# Patient Record
Sex: Female | Born: 1955 | Race: White | Hispanic: No | Smoking: Never smoker
Health system: Southern US, Community
[De-identification: ages and names within clinical notes are randomized; demographics above are authoritative.]

## PROBLEM LIST (undated history)

## (undated) DIAGNOSIS — I10 Essential (primary) hypertension: Secondary | ICD-10-CM

## (undated) DIAGNOSIS — E785 Hyperlipidemia, unspecified: Secondary | ICD-10-CM

## (undated) DIAGNOSIS — T7840XA Allergy, unspecified, initial encounter: Secondary | ICD-10-CM

## (undated) DIAGNOSIS — J45909 Unspecified asthma, uncomplicated: Secondary | ICD-10-CM

## (undated) HISTORY — PX: TUBAL LIGATION: SHX77

## (undated) HISTORY — PX: MENISCUS REPAIR: SHX5179

## (undated) HISTORY — DX: Hyperlipidemia, unspecified: E78.5

## (undated) HISTORY — PX: ABDOMINAL HYSTERECTOMY: SHX81

## (undated) HISTORY — DX: Allergy, unspecified, initial encounter: T78.40XA

## (undated) HISTORY — PX: CHOLECYSTECTOMY: SHX55

## (undated) HISTORY — PX: COLONOSCOPY: SHX174

---

## 1998-04-19 ENCOUNTER — Encounter: Admission: RE | Admit: 1998-04-19 | Discharge: 1998-07-18 | Payer: Self-pay | Admitting: Internal Medicine

## 2000-06-30 ENCOUNTER — Other Ambulatory Visit: Admission: RE | Admit: 2000-06-30 | Discharge: 2000-06-30 | Payer: Self-pay | Admitting: Gynecology

## 2000-07-06 ENCOUNTER — Other Ambulatory Visit: Admission: RE | Admit: 2000-07-06 | Discharge: 2000-07-06 | Payer: Self-pay | Admitting: Gynecology

## 2000-08-11 ENCOUNTER — Inpatient Hospital Stay (HOSPITAL_COMMUNITY): Admission: RE | Admit: 2000-08-11 | Discharge: 2000-08-13 | Payer: Self-pay | Admitting: Gynecology

## 2001-09-19 ENCOUNTER — Other Ambulatory Visit: Admission: RE | Admit: 2001-09-19 | Discharge: 2001-09-19 | Payer: Self-pay | Admitting: Gynecology

## 2001-10-31 ENCOUNTER — Ambulatory Visit (HOSPITAL_COMMUNITY): Admission: RE | Admit: 2001-10-31 | Discharge: 2001-11-01 | Payer: Self-pay | Admitting: General Surgery

## 2001-10-31 ENCOUNTER — Encounter: Payer: Self-pay | Admitting: General Surgery

## 2003-08-10 ENCOUNTER — Other Ambulatory Visit: Admission: RE | Admit: 2003-08-10 | Discharge: 2003-08-10 | Payer: Self-pay | Admitting: Gynecology

## 2004-08-22 ENCOUNTER — Other Ambulatory Visit: Admission: RE | Admit: 2004-08-22 | Discharge: 2004-08-22 | Payer: Self-pay | Admitting: Gynecology

## 2006-02-01 ENCOUNTER — Ambulatory Visit: Payer: Self-pay | Admitting: Internal Medicine

## 2006-02-16 ENCOUNTER — Ambulatory Visit: Payer: Self-pay | Admitting: Internal Medicine

## 2006-08-30 ENCOUNTER — Encounter: Admission: RE | Admit: 2006-08-30 | Discharge: 2006-08-30 | Payer: Self-pay | Admitting: Internal Medicine

## 2008-07-17 ENCOUNTER — Emergency Department (HOSPITAL_COMMUNITY): Admission: EM | Admit: 2008-07-17 | Discharge: 2008-07-17 | Payer: Self-pay | Admitting: Emergency Medicine

## 2008-07-19 ENCOUNTER — Ambulatory Visit: Payer: Self-pay

## 2008-08-01 ENCOUNTER — Ambulatory Visit: Payer: Self-pay | Admitting: Cardiology

## 2011-02-24 NOTE — Assessment & Plan Note (Signed)
[facility]                            CARDIOLOGY OFFICE NOTE   JUDEE, Sharon Buck                        MRN:          [id number]  DATE:08/01/2008                            DOB:          10-Robbs-1957    PRIMARY CARDIOLOGIST:  Bevelyn Buckles. Bensimhon, MD   PRIMARY CARE PHYSICIAN:  Oretha Milch, MD, [facility].   Sharon Buck is a very pleasant 55 year old white female who went to [facility] ER  with chest pain.  She ruled out for an MI and was seen by Dr. Gala Romney  and scheduled for an outpatient stress test.  On stress Myoview, the  patient had excellent exercise tolerance.  She exercised for 11 minutes,  stopped due to fatigue.  She did have some chest pain and shortness of  breath and equivocal ST changes.  There was no evidence of scar or  ischemia and ejection fraction was 71%.  The patient continues to have  some chest pain, shortness of breath and pounding.  She says she can  exercise at her club for 30 minutes on a bike or elliptical without any  problems.  She can be walking on [employer] and developed a pounding  associated with pressure and shortness of breath.  She feels it is all  stress-related.  She is trying to rise 2 of her grandchildren on her own  and has quite a bit of stress at work.  Cardiac risk factor is  significant for hypertension which is treated.  She is borderline  diabetic.  She has never smoked.  She has no cholesterol problems.  Her  father died of heart failure.  Both paternal grandparents died of heart  failure.  She does not know if any had MIs.  Mother died of a motor  vehicle accident, one sister died of diabetes complications as a child.  One brother is alive and well   ALLERGIES:  PREDNISONE.   CURRENT MEDICATIONS:  1. Lasix 80 mg b.i.d.  2. Avapro 150 mg one-half tablet daily.  3. Aspirin 81 mg daily.  4. Singulair.   PAST MEDICAL HISTORY:  Significant for asthma, diverticulosis,  hypertension and borderline  diabetes mellitus.   PHYSICAL EXAMINATION:  GENERAL:  This is a pleasant but anxious 55-year-  old white female in no acute distress.  VITAL SIGNS:  Blood pressure 128/78, pulse 62, weight 238.  NECK:  Without JVD, HJR, bruit, or thyroid enlargement.  LUNGS:  Clear anterior, posterior and lateral.  HEART:  Regular rate and rhythm at 62 beats per minute.  Normal S1 and  S2.  No murmur, rub, bruit, thrill or heave noted.  Distant heart  sounds.  ABDOMEN:  Soft without organomegaly, masses, lesions or abnormal  tenderness.  EXTREMITIES:  Without cyanosis, clubbing, or edema.  She has good distal  pulses.   EKG sinus bradycardia, otherwise normal.   IMPRESSION:  1. Chest pain, palpitations, shortness of breath with negative stress      Myoview on July 19, 2008.  2. Stress and anxiety.  3. Hypertension.  4. Borderline diabetes mellitus.  5. Questionable  family history of heart disease with several members      dying of heart failure   PLAN:  I discussed the possibility of an event recorder and 2-D echo.  The patient states she has had an enlarged heart by echo in the past.  The patient wants to pay off her bills at this time and try to reduce  the stress in her life and see if that makes a difference, which I think  is a reasonable approach.  We will schedule her to see Dr. Gala Romney  back in 2 months' time and at which time he can decide whether or not to  proceed with any further testing based on her symptoms.      Jacolyn Reedy, PA-C  Electronically Signed      Everardo Beals. Juanda Chance, MD, [facility]  Electronically Signed   ML/MedQ  DD: 08/01/2008  DT: 08/01/2008  Job #: [id number]   cc:   Larina Earthly, M.D.

## 2011-07-14 LAB — BASIC METABOLIC PANEL
BUN: 18
CO2: 22
Calcium: 9.5
Chloride: 103
Creatinine, Ser: 0.94
GFR calc Af Amer: >60
GFR calc non Af Amer: >60
Glucose, Bld: 98
Potassium: 3.7
Sodium: 134 — ABNORMAL LOW

## 2011-07-14 LAB — POCT CARDIAC MARKERS
CKMB, poc: <1 — ABNORMAL LOW
Myoglobin, poc: 103
Troponin i, poc: <0.05

## 2011-07-14 LAB — CBC
HCT: 40.3
Hemoglobin: 13.9
MCHC: 34.3
MCV: 91.8
Platelets: 237
RBC: 4.39
RDW: 12.8
WBC: 6.5

## 2011-07-14 LAB — DIFFERENTIAL
Basophils Absolute: 0.1
Basophils Relative: 1
Eosinophils Absolute: 0.1
Eosinophils Relative: 2
Lymphocytes Relative: 38
Lymphs Abs: 2.5
Monocytes Absolute: 0.4
Monocytes Relative: 6
Neutro Abs: 3.5
Neutrophils Relative %: 53

## 2013-02-28 ENCOUNTER — Emergency Department (HOSPITAL_COMMUNITY)
Admission: EM | Admit: 2013-02-28 | Discharge: 2013-03-01 | Disposition: A | Discharge status: Home / Self Care | Payer: No Typology Code available for payment source | Attending: Emergency Medicine | Admitting: Emergency Medicine

## 2013-02-28 ENCOUNTER — Emergency Department (HOSPITAL_COMMUNITY): Payer: No Typology Code available for payment source

## 2013-02-28 ENCOUNTER — Encounter (HOSPITAL_COMMUNITY): Payer: Self-pay | Admitting: Emergency Medicine

## 2013-02-28 DIAGNOSIS — S39012A Strain of muscle, fascia and tendon of lower back, initial encounter: Secondary | ICD-10-CM

## 2013-02-28 DIAGNOSIS — S20219A Contusion of unspecified front wall of thorax, initial encounter: Secondary | ICD-10-CM | POA: Insufficient documentation

## 2013-02-28 DIAGNOSIS — Z79899 Other long term (current) drug therapy: Secondary | ICD-10-CM | POA: Insufficient documentation

## 2013-02-28 DIAGNOSIS — S335XXA Sprain of ligaments of lumbar spine, initial encounter: Secondary | ICD-10-CM | POA: Insufficient documentation

## 2013-02-28 DIAGNOSIS — S139XXA Sprain of joints and ligaments of unspecified parts of neck, initial encounter: Secondary | ICD-10-CM | POA: Insufficient documentation

## 2013-02-28 DIAGNOSIS — Y92410 Unspecified street and highway as the place of occurrence of the external cause: Secondary | ICD-10-CM | POA: Insufficient documentation

## 2013-02-28 DIAGNOSIS — S161XXA Strain of muscle, fascia and tendon at neck level, initial encounter: Secondary | ICD-10-CM

## 2013-02-28 DIAGNOSIS — I10 Essential (primary) hypertension: Secondary | ICD-10-CM | POA: Insufficient documentation

## 2013-02-28 DIAGNOSIS — J45909 Unspecified asthma, uncomplicated: Secondary | ICD-10-CM | POA: Insufficient documentation

## 2013-02-28 DIAGNOSIS — S20212A Contusion of left front wall of thorax, initial encounter: Secondary | ICD-10-CM

## 2013-02-28 DIAGNOSIS — Y9389 Activity, other specified: Secondary | ICD-10-CM | POA: Insufficient documentation

## 2013-02-28 HISTORY — DX: Essential (primary) hypertension: I10

## 2013-02-28 HISTORY — DX: Unspecified asthma, uncomplicated: J45.909
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
## 2013-02-28 NOTE — ED Notes (Signed)
I placed a medium c-collar on the patient. 

## 2013-03-01 MED ORDER — CYCLOBENZAPRINE HCL 5 MG PO TABS: 5.0000 mg | ORAL_TABLET | Freq: Three times a day (TID) | ORAL | Status: DC | PRN

## 2013-03-01 MED ORDER — KETOROLAC TROMETHAMINE 30 MG/ML IJ SOLN
30.0000 mg | Freq: Once | INTRAMUSCULAR | Status: AC
  Administered 2013-03-01: 30 mg via INTRAMUSCULAR
  Filled 2013-03-01: qty 1

## 2013-03-01 MED ORDER — CYCLOBENZAPRINE HCL 10 MG PO TABS
5.0000 mg | ORAL_TABLET | Freq: Once | ORAL | Status: AC
  Administered 2013-03-01: 5 mg via ORAL
  Filled 2013-03-01: qty 1

## 2013-03-01 MED ORDER — HYDROCODONE-ACETAMINOPHEN 5-325 MG PO TABS: 1.0000 | ORAL_TABLET | Freq: Four times a day (QID) | ORAL | Status: DC | PRN

## 2013-03-01 MED ORDER — HYDROCODONE-ACETAMINOPHEN 5-325 MG PO TABS
1.0000 | ORAL_TABLET | Freq: Once | ORAL | Status: AC
  Administered 2013-03-01: 1 via ORAL
  Filled 2013-03-01: qty 1

## 2013-03-01 NOTE — ED Provider Notes (Signed)
Medical screening examination/treatment/procedure(s) were performed by non-physician practitioner and as supervising physician I was immediately available for consultation/collaboration.  Chrystel Barefield, MD 03/01/13 0504 
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
## 2016-03-23 ENCOUNTER — Encounter: Payer: Self-pay | Admitting: Internal Medicine

## 2016-05-20 DIAGNOSIS — E784 Other hyperlipidemia: Secondary | ICD-10-CM | POA: Diagnosis not present

## 2016-05-20 DIAGNOSIS — I10 Essential (primary) hypertension: Secondary | ICD-10-CM | POA: Diagnosis not present

## 2016-05-20 DIAGNOSIS — E669 Obesity, unspecified: Secondary | ICD-10-CM | POA: Diagnosis not present

## 2016-05-20 DIAGNOSIS — E119 Type 2 diabetes mellitus without complications: Secondary | ICD-10-CM | POA: Diagnosis not present

## 2016-05-22 DIAGNOSIS — Z6838 Body mass index (BMI) 38.0-38.9, adult: Secondary | ICD-10-CM | POA: Diagnosis not present

## 2016-05-22 DIAGNOSIS — H6123 Impacted cerumen, bilateral: Secondary | ICD-10-CM | POA: Diagnosis not present

## 2016-07-28 DIAGNOSIS — Z1231 Encounter for screening mammogram for malignant neoplasm of breast: Secondary | ICD-10-CM | POA: Diagnosis not present

## 2016-09-21 DIAGNOSIS — E669 Obesity, unspecified: Secondary | ICD-10-CM | POA: Diagnosis not present

## 2016-09-21 DIAGNOSIS — I10 Essential (primary) hypertension: Secondary | ICD-10-CM | POA: Diagnosis not present

## 2016-09-21 DIAGNOSIS — E119 Type 2 diabetes mellitus without complications: Secondary | ICD-10-CM | POA: Diagnosis not present

## 2016-09-21 DIAGNOSIS — F419 Anxiety disorder, unspecified: Secondary | ICD-10-CM | POA: Diagnosis not present

## 2017-01-19 DIAGNOSIS — I10 Essential (primary) hypertension: Secondary | ICD-10-CM | POA: Diagnosis not present

## 2017-01-19 DIAGNOSIS — E119 Type 2 diabetes mellitus without complications: Secondary | ICD-10-CM | POA: Diagnosis not present

## 2017-01-26 DIAGNOSIS — E669 Obesity, unspecified: Secondary | ICD-10-CM | POA: Diagnosis not present

## 2017-01-26 DIAGNOSIS — I10 Essential (primary) hypertension: Secondary | ICD-10-CM | POA: Diagnosis not present

## 2017-01-26 DIAGNOSIS — E784 Other hyperlipidemia: Secondary | ICD-10-CM | POA: Diagnosis not present

## 2017-01-26 DIAGNOSIS — E119 Type 2 diabetes mellitus without complications: Secondary | ICD-10-CM | POA: Diagnosis not present

## 2017-01-26 DIAGNOSIS — Z0000 Encounter for general adult medical examination without abnormal findings: Secondary | ICD-10-CM | POA: Diagnosis not present

## 2017-02-20 ENCOUNTER — Encounter (HOSPITAL_COMMUNITY): Payer: Self-pay | Admitting: Emergency Medicine

## 2017-02-20 ENCOUNTER — Ambulatory Visit (HOSPITAL_COMMUNITY)
Admission: EM | Admit: 2017-02-20 | Discharge: 2017-02-20 | Disposition: A | Discharge status: Home / Self Care | Payer: BLUE CROSS/BLUE SHIELD | Attending: Internal Medicine | Admitting: Internal Medicine

## 2017-02-20 DIAGNOSIS — S0181XA Laceration without foreign body of other part of head, initial encounter: Secondary | ICD-10-CM | POA: Diagnosis not present

## 2017-02-20 DIAGNOSIS — T07XXXA Unspecified multiple injuries, initial encounter: Secondary | ICD-10-CM | POA: Diagnosis not present

## 2017-02-20 MED ORDER — HYDROCODONE-ACETAMINOPHEN 5-325 MG PO TABS: 2.0000 | ORAL_TABLET | ORAL | 0 refills | Status: DC | PRN

## 2017-02-20 MED ORDER — NAPROXEN 500 MG PO TABS: 500.0000 mg | ORAL_TABLET | Freq: Two times a day (BID) | ORAL | 0 refills | Status: DC

## 2017-02-20 MED ORDER — TETRACAINE HCL 0.5 % OP SOLN
OPHTHALMIC | Status: AC
  Filled 2017-02-20: qty 2

## 2017-02-20 MED ORDER — SULFACETAMIDE SODIUM 10 % OP SOLN: 1.0000 [drp] | OPHTHALMIC | 0 refills | Status: DC

## 2017-02-20 MED ORDER — KETOROLAC TROMETHAMINE 60 MG/2ML IM SOLN
60.0000 mg | Freq: Once | INTRAMUSCULAR | Status: AC
  Administered 2017-02-20: 60 mg via INTRAMUSCULAR

## 2017-02-20 MED ORDER — AMOXICILLIN-POT CLAVULANATE 875-125 MG PO TABS: 1.0000 | ORAL_TABLET | Freq: Two times a day (BID) | ORAL | 0 refills | Status: DC

## 2017-02-20 MED ORDER — KETOROLAC TROMETHAMINE 60 MG/2ML IM SOLN
INTRAMUSCULAR | Status: AC
Start: 2017-02-20 — End: 2017-02-20
  Filled 2017-02-20: qty 2

## 2017-02-20 MED ORDER — FLUORESCEIN SODIUM 0.6 MG OP STRP
ORAL_STRIP | OPHTHALMIC | Status: AC
  Filled 2017-02-20: qty 1
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
## 2017-02-20 NOTE — ED Notes (Signed)
Gave pt ice pack per pt request.
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
## 2017-02-20 NOTE — Discharge Instructions (Addendum)
Wash scrapes and scratches gently with soap/water 1-2 times daily, apply antibiotic ointment and bandage.  Recheck if any increasing redness/swelling/pain/drainage or new fever>100.5, or if not starting to improve in a few days. Go to ED if eye pain is increasing or vision loss occurs.

## 2017-02-20 NOTE — ED Notes (Signed)
Dr Valere Dross saw patient in treatment room and requested a visual acuity to help determine best care facility for patient

## 2017-06-23 DIAGNOSIS — Z23 Encounter for immunization: Secondary | ICD-10-CM | POA: Diagnosis not present

## 2017-06-23 DIAGNOSIS — F418 Other specified anxiety disorders: Secondary | ICD-10-CM | POA: Diagnosis not present

## 2017-06-23 DIAGNOSIS — I10 Essential (primary) hypertension: Secondary | ICD-10-CM | POA: Diagnosis not present

## 2017-06-23 DIAGNOSIS — E119 Type 2 diabetes mellitus without complications: Secondary | ICD-10-CM | POA: Diagnosis not present

## 2017-06-23 DIAGNOSIS — E668 Other obesity: Secondary | ICD-10-CM | POA: Diagnosis not present

## 2017-08-03 DIAGNOSIS — Z1231 Encounter for screening mammogram for malignant neoplasm of breast: Secondary | ICD-10-CM | POA: Diagnosis not present

## 2017-10-21 DIAGNOSIS — R42 Dizziness and giddiness: Secondary | ICD-10-CM | POA: Diagnosis not present

## 2017-10-21 DIAGNOSIS — R079 Chest pain, unspecified: Secondary | ICD-10-CM | POA: Diagnosis not present

## 2017-10-21 DIAGNOSIS — R0602 Shortness of breath: Secondary | ICD-10-CM | POA: Diagnosis not present

## 2017-10-21 DIAGNOSIS — J40 Bronchitis, not specified as acute or chronic: Secondary | ICD-10-CM | POA: Diagnosis not present

## 2017-10-22 ENCOUNTER — Other Ambulatory Visit: Payer: Self-pay | Admitting: Internal Medicine

## 2017-10-22 ENCOUNTER — Ambulatory Visit
Admission: RE | Admit: 2017-10-22 | Discharge: 2017-10-22 | Disposition: A | Discharge status: Home / Self Care | Payer: BLUE CROSS/BLUE SHIELD | Source: Ambulatory Visit | Attending: Internal Medicine | Admitting: Internal Medicine

## 2017-10-22 DIAGNOSIS — R079 Chest pain, unspecified: Secondary | ICD-10-CM

## 2017-10-22 DIAGNOSIS — R42 Dizziness and giddiness: Secondary | ICD-10-CM

## 2017-10-22 DIAGNOSIS — I700 Atherosclerosis of aorta: Secondary | ICD-10-CM | POA: Diagnosis not present

## 2017-10-22 MED ORDER — IOPAMIDOL (ISOVUE-370) INJECTION 76%
75.0000 mL | Freq: Once | INTRAVENOUS | Status: AC | PRN
  Administered 2017-10-22: 75 mL via INTRAVENOUS

## 2017-11-09 DIAGNOSIS — I10 Essential (primary) hypertension: Secondary | ICD-10-CM | POA: Diagnosis not present

## 2017-11-09 DIAGNOSIS — Z1389 Encounter for screening for other disorder: Secondary | ICD-10-CM | POA: Diagnosis not present

## 2017-11-09 DIAGNOSIS — R0602 Shortness of breath: Secondary | ICD-10-CM | POA: Diagnosis not present

## 2017-11-09 DIAGNOSIS — E668 Other obesity: Secondary | ICD-10-CM | POA: Diagnosis not present

## 2017-11-09 DIAGNOSIS — E119 Type 2 diabetes mellitus without complications: Secondary | ICD-10-CM | POA: Diagnosis not present

## 2017-11-22 DIAGNOSIS — I209 Angina pectoris, unspecified: Secondary | ICD-10-CM | POA: Diagnosis not present

## 2017-11-22 DIAGNOSIS — I2510 Atherosclerotic heart disease of native coronary artery without angina pectoris: Secondary | ICD-10-CM | POA: Diagnosis not present

## 2017-11-22 DIAGNOSIS — I10 Essential (primary) hypertension: Secondary | ICD-10-CM | POA: Diagnosis not present

## 2017-11-22 DIAGNOSIS — E7800 Pure hypercholesterolemia, unspecified: Secondary | ICD-10-CM | POA: Diagnosis not present

## 2017-11-29 DIAGNOSIS — I10 Essential (primary) hypertension: Secondary | ICD-10-CM | POA: Diagnosis not present

## 2017-11-29 DIAGNOSIS — R0602 Shortness of breath: Secondary | ICD-10-CM | POA: Diagnosis not present

## 2017-11-29 DIAGNOSIS — R0789 Other chest pain: Secondary | ICD-10-CM | POA: Diagnosis not present

## 2017-12-07 DIAGNOSIS — R0789 Other chest pain: Secondary | ICD-10-CM | POA: Diagnosis not present

## 2017-12-07 DIAGNOSIS — R0609 Other forms of dyspnea: Secondary | ICD-10-CM | POA: Diagnosis not present

## 2017-12-15 DIAGNOSIS — I2510 Atherosclerotic heart disease of native coronary artery without angina pectoris: Secondary | ICD-10-CM | POA: Diagnosis not present

## 2017-12-15 DIAGNOSIS — E7800 Pure hypercholesterolemia, unspecified: Secondary | ICD-10-CM | POA: Diagnosis not present

## 2017-12-15 DIAGNOSIS — I10 Essential (primary) hypertension: Secondary | ICD-10-CM | POA: Diagnosis not present

## 2017-12-15 DIAGNOSIS — I209 Angina pectoris, unspecified: Secondary | ICD-10-CM | POA: Diagnosis not present

## 2018-01-05 DIAGNOSIS — I2510 Atherosclerotic heart disease of native coronary artery without angina pectoris: Secondary | ICD-10-CM | POA: Diagnosis not present

## 2018-01-05 DIAGNOSIS — I209 Angina pectoris, unspecified: Secondary | ICD-10-CM | POA: Diagnosis not present

## 2018-01-05 DIAGNOSIS — I10 Essential (primary) hypertension: Secondary | ICD-10-CM | POA: Diagnosis not present

## 2018-01-05 DIAGNOSIS — E7800 Pure hypercholesterolemia, unspecified: Secondary | ICD-10-CM | POA: Diagnosis not present

## 2018-01-07 DIAGNOSIS — I209 Angina pectoris, unspecified: Secondary | ICD-10-CM | POA: Diagnosis not present

## 2018-01-09 DIAGNOSIS — I209 Angina pectoris, unspecified: Secondary | ICD-10-CM | POA: Diagnosis present

## 2018-01-09 NOTE — H&P (Addendum)
OFFICE VISIT NOTES COPIED TO EPIC FOR DOCUMENTATION  . History of Present Illness Sharon Maine FNP-C; 01/18/18 4:10 PM) Patient words: Last OV 12/15/2017; 3 week f/u for angina.  The patient is a 62 year old female who presents for a Follow-up for Chest pain. Patient with past medical history of hypertension, hyperlipidemia, asthma, and coronary atherosclerosis by CT scan was initially referred to [place] for evaluation of chest pain. Her symptoms were concerning for angina pectoris, however, also had chest pain symptoms at rest. Associated symptoms included nausea and vomiting. Does have dyspnea on exertion that is chronic and hasn't changed. Underwent exercise nuclear stress test on 11/29/2017 that was considered low risk and echocardiogram on 12/07/2017 that showed mild LVH and diastolic dysfunction, otherwise normal echo. Due to concerning symptoms, and brought her back for follow-up for close monitoring. Amlodipine was increased to 10 mg at her last office visit and is tolerating this well. She has used nitroglycerin 4 times since last seen by [place] that was responsive. Has noticed that on days that she pushes herself walking has chest pain, but not necessarily on days with leisure walking. She has continued to have pain while sitting at work and admits to a significant amount of stress with her job.     Problem List/Past Medical Sharon Buck; 2018/01/18 3:20 PM) Asthma (J45.909)  Restless leg (G25.81)  Coronary artery calcification seen on CT scan (I25.10)  CTA chest 10/22/2017: Some calcific aortic including coronary atherosclerotic calcifications. Fatty infiltration of the liver. Benign essential hypertension (I10)  EKG 11/22/2017: Normal sinus rhythm at 63 bpm, normal axis, normal interval, no evidence of ischemia. Normal EKG. Angina pectoris (I20.9)  Exercise sestamibi stress test 11/29/2017: 1. The patient performed treadmill exercise using a Bruce protocol, completing 7:36 minutes.  The patient completed an estimated workload of 9.56 METS, reaching 99% of the maximum predicted heart rate. Exercise capacity fair for age. Appropriate heart and blood pressure response. Specific symptoms included Chest Pain and Shortness of Breath. The chest pain was relieved with rest. The stress test was terminated because of chest pain. 2. While patient did have exercise limiting chest pain, no ischemic changes seen on stress electrocardiogram. 3. The overall quality of the study is excellent. There is no evidence of abnormal lung activity. Stress and rest SPECT images demonstrate homogeneous tracer distribution throughout the myocardium. Gated SPECT imaging reveals normal myocardial thickening and wall motion. The left ventricular ejection fraction was normal (56%). 4. This is a low risk study. Hyperlipidemia, group A (E78.00)  Laboratory examination (Z01.89)  01/19/2017: Cholesterol 243, triglycerides 208, HDL 49, LDL 152. Dyspnea on exertion (R06.09)  Echocardiogram 12/07/2017: Left ventricle cavity is normal in size. Mild concentric hypertrophy of the left ventricle. Normal global wall motion. Doppler evidence of grade I (impaired) diastolic dysfunction, normal LAP. Calculated EF 63%. Left atrial cavity is mildly dilated. Mild (Grade I) mitral regurgitation. Normal right atrial pressure.  Allergies Sharon Buck; 01/18/2018 3:20 PM) PredniSONE *CORTICOSTEROIDS*  heart racing, rashes  Family History Sharon Buck; 2018/01/18 3:20 PM) Mother  Deceased. at age 60; MVA Father  Deceased. at age 28; CHF Sister 1  Deceased. older; age 66; DM1 Brother 1  older; No heart issues  Social History Sharon Buck; 01-18-2018 3:20 PM) Current tobacco use  Never smoker. Alcohol Use  Occasional alcohol use. Marital status  Single. Living Situation  Lives alone. Number of Children  2.  Past Surgical History Sharon Buck; January 18, 2018 3:20 PM) Hysterectomy [2001]: Gallbladder Surgery  [2004]:  Medication  History Sharon Maine, FNP-C; 2018-01-09 4:13 PM) AmLODIPine Besylate (10MG Tablet, 1 (one) Tablet Oral daily, Taken starting 12/15/2017) Active. Rosuvastatin Calcium (20MG Tablet, 1 (one) Tablet Tablet Oral daily in the evening, Taken starting 11/22/2017) Active. Nitroglycerin (0.4MG Tab Sublingual, 1 (one) Tablet Tablet Sublingual every 5 minutes as needed for chest pain., Taken starting 11/22/2017) Active. ProAir HFA (108 (90 Base)MCG/ACT Aerosol Soln, Inhalation as needed) Active. Breo Ellipta (100-25MCG/INH Aero Pow Br Act, Inhalation as needed) Active. Furosemide (40MG Tablet, 2 Oral daily) Active. DULoxetine HCl (30MG Capsule DR Part, 3 Oral daily) Active. Pramipexole Dihydrochloride (0.25MG Tablet, 1-2 Oral bedtime) Active. Medications Reconciled (verbally)  Diagnostic Studies History Sharon Buck; 01/09/2018 3:29 PM) Nuclear stress test  Exercise sestamibi stress test 11/29/2017: 1. The patient performed treadmill exercise using a Bruce protocol, completing 7:36 minutes. The patient completed an estimated workload of 9.56 METS, reaching 99% of the maximum predicted heart rate. Exercise capacity fair for age. Appropriate heart and blood pressure response. Specific symptoms included Chest Pain and Shortness of Breath. The chest pain was relieved with rest. The stress test was terminated because of chest pain. 2. While patient did have exercise limiting chest pain, no ischemic changes seen on stress electrocardiogram. 3. The overall quality of the study is excellent. There is no evidence of abnormal lung activity. Stress and rest SPECT images demonstrate homogeneous tracer distribution throughout the myocardium. Gated SPECT imaging reveals normal myocardial thickening and wall motion. The left ventricular ejection fraction was normal (56%). 4. This is a low risk study. Nuclear stress test  Echocardiogram  Echocardiogram 12/07/2017: Left ventricle  cavity is normal in size. Mild concentric hypertrophy of the left ventricle. Normal global wall motion. Doppler evidence of grade I (impaired) diastolic dysfunction, normal LAP. Calculated EF 63%. Left atrial cavity is mildly dilated. Mild (Grade I) mitral regurgitation. Normal right atrial pressure. CT Scan [10/2017]:    Review of Systems Sharon Maine FNP-C; January 09, 2018 4:09 PM) General Not Present- Appetite Loss and Weight Gain. Respiratory Not Present- Chronic Cough and Wakes up from Sleep Wheezing or Short of Breath. Cardiovascular Present- Chest Pain (with emotional stress or with exertional activities.), Difficulty Breathing On Exertion and Edema (occasional). Not Present- Difficulty Breathing Lying Down and Palpitations. Gastrointestinal Not Present- Black, Tarry Stool and Difficulty Swallowing. Musculoskeletal Not Present- Decreased Range of Motion and Muscle Atrophy. Neurological Not Present- Attention Deficit. Psychiatric Not Present- Personality Changes and Suicidal Ideation. Endocrine Not Present- Cold Intolerance and Heat Intolerance. Hematology Not Present- Abnormal Bleeding. All other systems negative  Vitals Sharon Buck; 2018-01-09 3:36 PM) January 09, 2018 3:26 PM Weight: 244.19 lb Height: 67in Body Surface Area: 2.2 m Body Mass Index: 38.24 kg/m  Pulse: 77 (Regular)  P.OX: 97% (Room air) BP: 122/76 (Sitting, Left Arm, Standard)       Physical Exam Sharon Maine, FNP-C; January 09, 2018 4:5 PM) General Mental Status-Alert. General Appearance-Cooperative, Appears stated age, Not in acute distress. Build & Nutrition-Well built and Moderately obese.  Head and Neck Neck -Note: Short neck and difficult to evaluate JVD.  Thyroid Gland Characteristics - no palpable nodules, no palpable enlargement.  Cardiovascular Cardiovascular examination reveals -normal heart sounds, regular rate and rhythm with no murmurs. Inspection Jugular vein -  Right - No Distention.  Abdomen Inspection Contour - Obese and Pannus present. Palpation/Percussion Normal exam - Non Tender and No hepatosplenomegaly. Auscultation Normal exam - Bowel sounds normal.  Peripheral Vascular Lower Extremity Inspection - Bilateral - Inspection Normal. Palpation - Edema - Bilateral - No edema. Femoral pulse -  Bilateral - Feeble(Pulsus difficult to feel due to patient's bodily habitus.), No Bruits. Popliteal pulse - Bilateral - Feeble(Pulsus difficult to feel due to patient's bodily habitus.). Dorsalis pedis pulse - Bilateral - Normal. Posterior tibial pulse - Bilateral - Normal. Carotid arteries - Bilateral-No Carotid bruit.  Neurologic Neurologic evaluation reveals -alert and oriented x 3 with no impairment of recent or remote memory. Motor-Grossly intact without any focal deficits.  Musculoskeletal Global Assessment Left Lower Extremity - normal range of motion without pain. Right Lower Extremity - normal range of motion without pain.    Assessment & Plan Sharon Maine FNP-C; 01/05/2018 4:13 PM) Angina pectoris (I20.9) Story: Exercise sestamibi stress test 11/29/2017: 1. The patient performed treadmill exercise using a Bruce protocol, completing 7:36 minutes. The patient completed an estimated workload of 9.56 METS, reaching 99% of the maximum predicted heart rate. Exercise capacity fair for age. Appropriate heart and blood pressure response. Specific symptoms included Chest Pain and Shortness of Breath. The chest pain was relieved with rest. The stress test was terminated because of chest pain. 2. While patient did have exercise limiting chest pain, no ischemic changes seen on stress electrocardiogram. 3. The overall quality of the study is excellent. There is no evidence of abnormal lung activity. Stress and rest SPECT images demonstrate homogeneous tracer distribution throughout the myocardium. Gated SPECT imaging reveals normal myocardial  thickening and wall motion. The left ventricular ejection fraction was normal (56%). 4. This is a low risk study. Coronary artery calcification seen on CT scan (I25.10) Story: CTA chest 10/22/2017: Some calcific aortic including coronary atherosclerotic calcifications. Fatty infiltration of the liver. Benign essential hypertension (I10) Story: EKG 11/22/2017: Normal sinus rhythm at 63 bpm, normal axis, normal interval, no evidence of ischemia. Normal EKG. Hyperlipidemia, group A (E78.00) Dyspnea on exertion (R06.09) Story: Echocardiogram 12/07/2017: Left ventricle cavity is normal in size. Mild concentric hypertrophy of the left ventricle. Normal global wall motion. Doppler evidence of grade I (impaired) diastolic dysfunction, normal LAP. Calculated EF 63%. Left atrial cavity is mildly dilated. Mild (Grade I) mitral regurgitation. Normal right atrial pressure. Laboratory examination (Z01.89) Story: Labs 01/07/2018: Serum glucose 107 mg, nonfasting, BUN 12, creatinine 0.76, eGFR 84 mL, sodium 145.  HB 13.6/HCT 89.8, platelets 269.  Pro time normal.  01/19/2017: Cholesterol 243, triglycerides 208, HDL 49, LDL 152.  Note:. Recommendation:  Patient presents for three-week follow-up for angina pectoris. She is continue to have symptoms that are concerning for angina pectoris and chest pain has been responsive to nitroglycerin. I discussed options of medications versus coronary angiogram. Feel that patient would benefit from coronary angiogram for definitive evaluation of her symptoms and she is agreeable to this. Schedule for cardiac catheterization, and possible angioplasty. We discussed regarding risks, benefits, alternatives to this including stress testing, CTA and continued medical therapy. Patient wants to proceed. Understands <1-2% risk of death, stroke, MI, urgent CABG, bleeding, infection, renal failure but not limited to these. Blood pressure has improved with increased dose of  amlodipine, continue the same. Advised her to not exercise until she can undergo her procedure. We'll see her back after the procedure for further recommendations and reevaluation.  CC: Dr. Prince Solian    Signed by Sharon Maine, FNP-C (01/05/2018 4:13 PM)

## 2018-01-14 ENCOUNTER — Other Ambulatory Visit: Payer: Self-pay

## 2018-01-14 ENCOUNTER — Ambulatory Visit (HOSPITAL_COMMUNITY)
Admission: RE | Disposition: A | Discharge status: Home / Self Care | Payer: Self-pay | Source: Ambulatory Visit | Attending: Cardiology

## 2018-01-14 ENCOUNTER — Ambulatory Visit (HOSPITAL_COMMUNITY)
Admission: RE | Admit: 2018-01-14 | Discharge: 2018-01-14 | Disposition: A | Discharge status: Home / Self Care | Payer: BLUE CROSS/BLUE SHIELD | Source: Ambulatory Visit | Attending: Cardiology | Admitting: Cardiology

## 2018-01-14 DIAGNOSIS — I2510 Atherosclerotic heart disease of native coronary artery without angina pectoris: Secondary | ICD-10-CM | POA: Diagnosis not present

## 2018-01-14 DIAGNOSIS — I25119 Atherosclerotic heart disease of native coronary artery with unspecified angina pectoris: Secondary | ICD-10-CM | POA: Diagnosis not present

## 2018-01-14 DIAGNOSIS — Z6838 Body mass index (BMI) 38.0-38.9, adult: Secondary | ICD-10-CM | POA: Diagnosis not present

## 2018-01-14 DIAGNOSIS — J45909 Unspecified asthma, uncomplicated: Secondary | ICD-10-CM | POA: Insufficient documentation

## 2018-01-14 DIAGNOSIS — Z955 Presence of coronary angioplasty implant and graft: Secondary | ICD-10-CM

## 2018-01-14 DIAGNOSIS — I2584 Coronary atherosclerosis due to calcified coronary lesion: Secondary | ICD-10-CM | POA: Insufficient documentation

## 2018-01-14 DIAGNOSIS — E785 Hyperlipidemia, unspecified: Secondary | ICD-10-CM | POA: Diagnosis not present

## 2018-01-14 DIAGNOSIS — Z888 Allergy status to other drugs, medicaments and biological substances status: Secondary | ICD-10-CM | POA: Insufficient documentation

## 2018-01-14 DIAGNOSIS — Z7951 Long term (current) use of inhaled steroids: Secondary | ICD-10-CM | POA: Insufficient documentation

## 2018-01-14 DIAGNOSIS — I10 Essential (primary) hypertension: Secondary | ICD-10-CM | POA: Diagnosis not present

## 2018-01-14 DIAGNOSIS — Z79899 Other long term (current) drug therapy: Secondary | ICD-10-CM | POA: Diagnosis not present

## 2018-01-14 DIAGNOSIS — I209 Angina pectoris, unspecified: Secondary | ICD-10-CM | POA: Diagnosis present

## 2018-01-14 HISTORY — PX: INTRAVASCULAR PRESSURE WIRE/FFR STUDY: CATH118243

## 2018-01-14 HISTORY — PX: CORONARY STENT INTERVENTION: CATH118234

## 2018-01-14 HISTORY — PX: LEFT HEART CATH AND CORONARY ANGIOGRAPHY: CATH118249

## 2018-01-14 LAB — POCT ACTIVATED CLOTTING TIME
Activated Clotting Time: 208 seconds
Activated Clotting Time: 285 seconds

## 2018-01-14 SURGERY — LEFT HEART CATH AND CORONARY ANGIOGRAPHY
Anesthesia: LOCAL

## 2018-01-14 MED ORDER — HEPARIN (PORCINE) IN NACL 2-0.9 UNIT/ML-% IJ SOLN
INTRAMUSCULAR | Status: AC | PRN
  Administered 2018-01-14 (×2): 500 mL

## 2018-01-14 MED ORDER — MIDAZOLAM HCL 2 MG/2ML IJ SOLN
INTRAMUSCULAR | Status: AC
  Filled 2018-01-14: qty 2

## 2018-01-14 MED ORDER — ONDANSETRON HCL 4 MG/2ML IJ SOLN: 4.0000 mg | Freq: Four times a day (QID) | INTRAMUSCULAR | Status: DC | PRN

## 2018-01-14 MED ORDER — ADENOSINE (DIAGNOSTIC) 140MCG/KG/MIN
INTRAVENOUS | Status: DC | PRN
  Administered 2018-01-14: 140 ug/kg/min via INTRAVENOUS

## 2018-01-14 MED ORDER — HYDROMORPHONE HCL 1 MG/ML IJ SOLN
INTRAMUSCULAR | Status: AC
  Filled 2018-01-14: qty 0.5

## 2018-01-14 MED ORDER — CLOPIDOGREL BISULFATE 300 MG PO TABS
ORAL_TABLET | ORAL | Status: AC
  Filled 2018-01-14: qty 1

## 2018-01-14 MED ORDER — LABETALOL HCL 5 MG/ML IV SOLN: 10.0000 mg | INTRAVENOUS | Status: AC | PRN

## 2018-01-14 MED ORDER — ACETAMINOPHEN 325 MG PO TABS: 650.0000 mg | ORAL_TABLET | ORAL | Status: DC | PRN

## 2018-01-14 MED ORDER — MIDAZOLAM HCL 2 MG/2ML IJ SOLN
INTRAMUSCULAR | Status: DC | PRN
  Administered 2018-01-14: 2 mg via INTRAVENOUS
  Administered 2018-01-14: 1 mg via INTRAVENOUS

## 2018-01-14 MED ORDER — ASPIRIN 81 MG PO CHEW
81.0000 mg | CHEWABLE_TABLET | ORAL | Status: AC
  Administered 2018-01-14: 81 mg via ORAL

## 2018-01-14 MED ORDER — NITROGLYCERIN 1 MG/10 ML FOR IR/CATH LAB
INTRA_ARTERIAL | Status: DC | PRN
  Administered 2018-01-14 (×2): 200 ug

## 2018-01-14 MED ORDER — CLOPIDOGREL BISULFATE 75 MG PO TABS: 75.0000 mg | ORAL_TABLET | Freq: Every day | ORAL | Status: DC

## 2018-01-14 MED ORDER — HYDRALAZINE HCL 20 MG/ML IJ SOLN: 5.0000 mg | INTRAMUSCULAR | Status: AC | PRN

## 2018-01-14 MED ORDER — ADENOSINE 12 MG/4ML IV SOLN
INTRAVENOUS | Status: AC
  Filled 2018-01-14: qty 12

## 2018-01-14 MED ORDER — HEPARIN SODIUM (PORCINE) 1000 UNIT/ML IJ SOLN
INTRAMUSCULAR | Status: DC | PRN
  Administered 2018-01-14: 8000 [IU] via INTRAVENOUS
  Administered 2018-01-14: 4000 [IU] via INTRAVENOUS

## 2018-01-14 MED ORDER — CLOPIDOGREL BISULFATE 300 MG PO TABS
ORAL_TABLET | ORAL | Status: DC | PRN
  Administered 2018-01-14: 600 mg via ORAL

## 2018-01-14 MED ORDER — VERAPAMIL HCL 2.5 MG/ML IV SOLN
INTRA_ARTERIAL | Status: DC | PRN
  Administered 2018-01-14: 10 mL via INTRA_ARTERIAL

## 2018-01-14 MED ORDER — SODIUM CHLORIDE 0.9 % IV SOLN: INTRAVENOUS | Status: AC

## 2018-01-14 MED ORDER — HEPARIN (PORCINE) IN NACL 2-0.9 UNIT/ML-% IJ SOLN
INTRAMUSCULAR | Status: AC
  Filled 2018-01-14: qty 1000

## 2018-01-14 MED ORDER — IOHEXOL 350 MG/ML SOLN
INTRAVENOUS | Status: DC | PRN
  Administered 2018-01-14: 110 mL via INTRAVENOUS

## 2018-01-14 MED ORDER — VERAPAMIL HCL 2.5 MG/ML IV SOLN
INTRAVENOUS | Status: AC
  Filled 2018-01-14: qty 2

## 2018-01-14 MED ORDER — SODIUM CHLORIDE 0.9% FLUSH: 3.0000 mL | INTRAVENOUS | Status: DC | PRN

## 2018-01-14 MED ORDER — SODIUM CHLORIDE 0.9% FLUSH: 3.0000 mL | Freq: Two times a day (BID) | INTRAVENOUS | Status: DC

## 2018-01-14 MED ORDER — SODIUM CHLORIDE 0.9 % IV SOLN: 250.0000 mL | INTRAVENOUS | Status: DC | PRN

## 2018-01-14 MED ORDER — ASPIRIN 81 MG PO CHEW: 81.0000 mg | CHEWABLE_TABLET | Freq: Every day | ORAL | 3 refills | Status: AC

## 2018-01-14 MED ORDER — LIDOCAINE HCL (PF) 1 % IJ SOLN
INTRAMUSCULAR | Status: DC | PRN
  Administered 2018-01-14: 2 mL

## 2018-01-14 MED ORDER — NITROGLYCERIN 1 MG/10 ML FOR IR/CATH LAB
INTRA_ARTERIAL | Status: AC
  Filled 2018-01-14: qty 10

## 2018-01-14 MED ORDER — ASPIRIN 81 MG PO CHEW
CHEWABLE_TABLET | ORAL | Status: AC
  Filled 2018-01-14: qty 1

## 2018-01-14 MED ORDER — SODIUM CHLORIDE 0.9 % WEIGHT BASED INFUSION: 1.0000 mL/kg/h | INTRAVENOUS | Status: DC

## 2018-01-14 MED ORDER — HEPARIN SODIUM (PORCINE) 1000 UNIT/ML IJ SOLN
INTRAMUSCULAR | Status: AC
  Filled 2018-01-14: qty 1

## 2018-01-14 MED ORDER — SODIUM CHLORIDE 0.9 % IV SOLN
INTRAVENOUS | Status: AC | PRN
  Administered 2018-01-14: 500 mL via INTRAVENOUS

## 2018-01-14 MED ORDER — HYDROMORPHONE HCL 1 MG/ML IJ SOLN
INTRAMUSCULAR | Status: DC | PRN
  Administered 2018-01-14: 0.5 mg via INTRAVENOUS

## 2018-01-14 MED ORDER — ADENOSINE 12 MG/4ML IV SOLN
INTRAVENOUS | Status: AC
  Filled 2018-01-14: qty 4

## 2018-01-14 MED ORDER — CLOPIDOGREL BISULFATE 75 MG PO TABS: 75.0000 mg | ORAL_TABLET | Freq: Every day | ORAL | 3 refills | Status: AC

## 2018-01-14 MED ORDER — SODIUM CHLORIDE 0.9 % WEIGHT BASED INFUSION
3.0000 mL/kg/h | INTRAVENOUS | Status: DC
  Administered 2018-01-14: 3 mL/kg/h via INTRAVENOUS

## 2018-01-14 SURGICAL SUPPLY — 19 items
BALLN EMERGE MR 2.5X15 (BALLOONS) ×2
BALLOON EMERGE MR 2.5X15 (BALLOONS) IMPLANT
BAND CMPR LRG ZPHR (HEMOSTASIS) ×1
BAND ZEPHYR COMPRESS 30 LONG (HEMOSTASIS) ×1 IMPLANT
CATH MICROCATH NAVVUS (MICROCATHETER) IMPLANT
CATH VISTA GUIDE 6FR XBLAD3.5 (CATHETERS) ×1 IMPLANT
GLIDESHEATH SLEND A-KIT 6F 20G (SHEATH) ×1 IMPLANT
GUIDEWIRE INQWIRE 1.5J.035X260 (WIRE) IMPLANT
INQWIRE 1.5J .035X260CM (WIRE) ×2
KIT ENCORE 26 ADVANTAGE (KITS) ×1 IMPLANT
KIT ESSENTIALS PG (KITS) ×1 IMPLANT
KIT HEART LEFT (KITS) ×2 IMPLANT
KIT HEMO VALVE WATCHDOG (MISCELLANEOUS) ×1 IMPLANT
MICROCATHETER NAVVUS (MICROCATHETER) ×2
PACK CARDIAC CATHETERIZATION (CUSTOM PROCEDURE TRAY) ×2 IMPLANT
STENT SIERRA 3.50 X 28 MM (Permanent Stent) ×1 IMPLANT
TRANSDUCER W/STOPCOCK (MISCELLANEOUS) ×2 IMPLANT
TUBING CIL FLEX 10 FLL-RA (TUBING) ×2 IMPLANT
WIRE RUNTHROUGH .014X180CM (WIRE) ×1 IMPLANT

## 2018-01-14 NOTE — Research (Signed)
CADFEM Informed Consent   Subject Name: Sharon Buck  Subject met inclusion and exclusion criteria.  The informed consent form, study requirements and expectations were reviewed with the subject and questions and concerns were addressed prior to the signing of the consent form.  The subject verbalized understanding of the trail requirements.  The subject agreed to participate in the CADFEM trial and signed the informed consent.  The informed consent was obtained prior to performance of any protocol-specific procedures for the subject.  A copy of the signed informed consent was given to the subject and a copy was placed in the subject's medical record.  [name] 01/14/2018, 9:25 AM

## 2018-01-14 NOTE — Progress Notes (Signed)
Right radial site with no bleeding, hematoma. Good capillary refill.  Okay to discharge. ASA 81 mg  plavix 75 mg daily prescriptions sent to patient's pharmacy.  Nigel Mormon, MD [facility] Pager: [phone] Office: [phone] If no answer Cell [phone]

## 2018-01-14 NOTE — Progress Notes (Signed)
Discussed stent, restrictions, Plavix, diet, ex, NTG, and CRPII. Voiced understanding and requests her referral be sent to [facility]. Pt sts she walks with her coworkers 20 min x2 a day. Encouraged her to build back up to this. Good reception. [phone] Yves Dill CES, ACSM 3:03 PM 01/14/2018

## 2018-01-14 NOTE — Discharge Instructions (Signed)
**Note Maisey Deandrade-identified via Obfuscation** Radial Site Care °Refer to this sheet in the next few weeks. These instructions provide you with information about caring for yourself after your procedure. Your health care provider Sickinger also give you more specific instructions. Your treatment has been planned according to current medical practices, but problems sometimes occur. Call your health care provider if you have any problems or questions after your procedure. °What can I expect after the procedure? °After your procedure, it is typical to have the following: °· Bruising at the radial site that usually fades within 1-2 weeks. °· Blood collecting in the tissue (hematoma) that [place] be painful to the touch. It should usually decrease in size and tenderness within 1-2 weeks. ° °Follow these instructions at home: °· Take medicines only as directed by your health care provider. °· You Monarrez shower 24-48 hours after the procedure or as directed by your health care provider. Remove the bandage (dressing) and gently wash the site with plain soap and water. Pat the area dry with a clean towel. Do not rub the site, because this Osorto cause bleeding. °· Do not take baths, swim, or use a hot tub until your health care provider approves. °· Check your insertion site every day for redness, swelling, or drainage. °· Do not apply powder or lotion to the site. °· Do not flex or bend the affected arm for 24 hours or as directed by your health care provider. °· Do not push or pull heavy objects with the affected arm for 24 hours or as directed by your health care provider. °· Do not lift over 10 lb (4.5 kg) for 5 days after your procedure or as directed by your health care provider. °· Ask your health care provider when it is okay to: °? Return to work or school. °? Resume usual physical activities or sports. °? Resume sexual activity. °· Do not drive home if you are discharged the same day as the procedure. Have someone else drive you. °· You [address] 24 hours after the procedure  unless otherwise instructed by your health care provider. °· Do not operate machinery or power tools for 24 hours after the procedure. °· If your procedure was done as an outpatient procedure, which means that you went home the same day as your procedure, a responsible adult should be with you for the first 24 hours after you arrive home. °· Keep all follow-up visits as directed by your health care provider. This is important. °Contact a health care provider if: °· You have a fever. °· You have chills. °· You have increased bleeding from the radial site. Hold pressure on the site. °Get help right away if: °· You have unusual pain at the radial site. °· You have redness, warmth, or swelling at the radial site. °· You have drainage (other than a small amount of blood on the dressing) from the radial site. °· The radial site is bleeding, and the bleeding does not stop after 30 minutes of holding steady pressure on the site. °· Your arm or hand becomes pale, cool, tingly, or numb. °This information is not intended to replace advice given to you by your health care provider. Make sure you discuss any questions you have with your health care provider. °Document Released: 10/31/2010 Document Revised: 03/05/2016 Document Reviewed: 04/16/2014 °Elsevier Interactive Patient Education © 2018 Elsevier Inc. ° °

## 2018-01-14 NOTE — Interval H&P Note (Signed)
History and Physical Interval Note:  01/14/2018 7:38 AM  Sharon Buck  has presented today for surgery, with the diagnosis of cp  The various methods of treatment have been discussed with the patient and family. After consideration of risks, benefits and other options for treatment, the patient has consented to  Procedure(s): LEFT HEART CATH AND CORONARY ANGIOGRAPHY (N/A) and possible angioplasty as a surgical intervention .  The patient's history has been reviewed, patient examined, no change in status, stable for surgery.  I have reviewed the patient's chart and labs.  Questions were answered to the patient's satisfaction.  Symptom Status: Ischemic Symptoms Non-invasive Testing: Low risk If no or indeterminate stress test, FFR/iFR results in all diseased vessels: N/A Diabetes Mellitus: No S/P CABG: No Antianginal therapy (number of long-acting drugs): >=2 Patient undergoing renal transplant: No Patient undergoing percutaneous valve procedure: No   1 Vessel Disease No proximal LAD involvement, No proximal left dominant LCX involvement  PCI: A (7);  Indication 1  CABG: M (5);  Indication 1 Proximal left dominant LCX involvement  PCI: A (7);  Indication 4  CABG: A (7);  Indication 4 Proximal LAD involvement  PCI: A (7);  Indication 4  CABG: A (7);  Indication 4  2 Vessel Disease No proximal LAD involvement  PCI: A (7);  Indication 7  CABG: M (6);  Indication 7 Proximal LAD involvement  PCI: A (7);  Indication 10  CABG: A (7);  Indication 10  3 Vessel Disease Low disease complexity (e.g., focal stenoses, SYNTAX <=22)  PCI: A (7);  Indication 16  CABG: A (7);  Indication 16 Intermediate or high disease complexity (e.g., SYNTAX >=23)  PCI: M (6);  Indication 20  CABG: A (8);  Indication 20  Left Main Disease Isolated LMCA disease: ostial or midshaft  PCI: A (7);  Indication 24  CABG: A (9);  Indication 24 Isolated LMCA disease: bifurcation involvement  PCI: M (6);   Indication 25  CABG: A (9);  Indication 25 LMCA ostial or midshaft, concurrent low disease burden multivessel disease (e.g., 1-2 additional focal stenoses, SYNTAX <=22)  PCI: A (7);  Indication 26  CABG: A (9);  Indication 26 LMCA ostial or midshaft, concurrent intermediate or high disease burden multivessel disease (e.g., 1-2 additional bifurcation stenoses, long stenoses, SYNTAX >=23)  PCI: M (4);  Indication 27  CABG: A (9);  Indication 27 LMCA bifurcation involvement, concurrent low disease burden multivessel disease (e.g., 1-2 additional focal stenoses, SYNTAX <=22)  PCI: M (6);  Indication 28  CABG: A (9);  Indication 28 LMCA bifurcation involvement, concurrent intermediate or high disease burden multivessel disease (e.g., 1-2 additional bifurcation stenoses, long stenoses, SYNTAX >=23)  PCI: R (3);  Indication 29  CABG: A (9);  Indication 29  Notes:  A indicates appropriate. M indicates Virgil be appropriate. R indicates rarely appropriate. Number in parentheses is median score for that indication. Reclassify indicates number of functionally diseased vessels should be decreased given negative FFR/iFR. Re-evaluate the scenario interpreting any FFR/iFR negative vessel as being not significantly stenosed.  Disease means involved vessel provides flow to a sufficient amount of myocardium to be clinically important.  If FFR testing indicates a vessel is not significant, that vessel should not be considered diseased (and the patient should be reclassified with respect to extent of functionally significant disease).  Proximal LAD + proximal left dominant LCX is considered 3 vessel CAD  2 Vessel CAD with FFR/iFR abnormal in only 1 but not both is  considered 1 vessel CAD  Disease complexity includes occlusion, bifurcation, trifurcation, ostial, >20 mm, tortuosity, calcification, thrombus  LMCA disease is >=50% by angiography, MLD <2.8 mm, MLA <6 mm2; MLA 6-7.5 mm2 requires further physiologic  See  Table B for risk stratification based on noninvasive testing  Journal of the [employer] of Cardiology Mar 2017, 23391; DOI: 10.1016/j.jacc.2017.02.001 [email or web address].de.[email or web address].full-text.pdf This App  2018 by the Society for Cardiovascular Angiography and Interventions    Adrian Prows

## 2018-01-17 ENCOUNTER — Encounter (HOSPITAL_COMMUNITY): Payer: Self-pay | Admitting: Cardiology

## 2018-01-17 ENCOUNTER — Telehealth (HOSPITAL_COMMUNITY): Payer: Self-pay

## 2018-01-17 MED FILL — Heparin Sodium (Porcine) 2 Unit/ML in Sodium Chloride 0.9%: INTRAMUSCULAR | Qty: 1000 | Status: AC

## 2018-01-17 NOTE — Telephone Encounter (Signed)
Patients insurance is active and benefits verified through [facility] - No co-pay, deductible amount of $750.00/$750.00 has been met, out of pocket amount of $3,750/$1,119 has been met, 10% co-insurance, and no pre-authorization is required. Passport/reference [phone]  Patient will be contacted and scheduled upon review by the RN Navigator.

## 2018-01-21 ENCOUNTER — Encounter (HOSPITAL_COMMUNITY): Payer: Self-pay | Admitting: Cardiology

## 2018-01-21 DIAGNOSIS — I10 Essential (primary) hypertension: Secondary | ICD-10-CM | POA: Diagnosis not present

## 2018-01-21 DIAGNOSIS — Z95820 Peripheral vascular angioplasty status with implants and grafts: Secondary | ICD-10-CM | POA: Diagnosis not present

## 2018-01-21 DIAGNOSIS — I2510 Atherosclerotic heart disease of native coronary artery without angina pectoris: Secondary | ICD-10-CM | POA: Diagnosis not present

## 2018-01-21 DIAGNOSIS — E7800 Pure hypercholesterolemia, unspecified: Secondary | ICD-10-CM | POA: Diagnosis not present

## 2018-01-24 ENCOUNTER — Telehealth (HOSPITAL_COMMUNITY): Payer: Self-pay

## 2018-01-24 NOTE — Telephone Encounter (Signed)
Attempted to call patient in regards to Cardiac Rehab - lm on vm °

## 2018-01-24 NOTE — Telephone Encounter (Signed)
Patient returned phone call in regards to Cardiac Rehab - Patient stated she cannot afford the program. Offered maintenance program and she stated she would not be able to "swing" it. Closed referral.

## 2018-01-26 DIAGNOSIS — Z0000 Encounter for general adult medical examination without abnormal findings: Secondary | ICD-10-CM | POA: Diagnosis not present

## 2018-01-26 DIAGNOSIS — R82998 Other abnormal findings in urine: Secondary | ICD-10-CM | POA: Diagnosis not present

## 2018-01-26 DIAGNOSIS — E119 Type 2 diabetes mellitus without complications: Secondary | ICD-10-CM | POA: Diagnosis not present

## 2018-02-02 DIAGNOSIS — E7849 Other hyperlipidemia: Secondary | ICD-10-CM | POA: Diagnosis not present

## 2018-02-02 DIAGNOSIS — I10 Essential (primary) hypertension: Secondary | ICD-10-CM | POA: Diagnosis not present

## 2018-02-02 DIAGNOSIS — Z0000 Encounter for general adult medical examination without abnormal findings: Secondary | ICD-10-CM | POA: Diagnosis not present

## 2018-02-02 DIAGNOSIS — E668 Other obesity: Secondary | ICD-10-CM | POA: Diagnosis not present

## 2018-02-02 DIAGNOSIS — E038 Other specified hypothyroidism: Secondary | ICD-10-CM | POA: Diagnosis not present

## 2018-02-02 DIAGNOSIS — E119 Type 2 diabetes mellitus without complications: Secondary | ICD-10-CM | POA: Diagnosis not present

## 2018-03-22 ENCOUNTER — Encounter: Payer: Self-pay | Admitting: Neurology

## 2018-03-22 ENCOUNTER — Ambulatory Visit (INDEPENDENT_AMBULATORY_CARE_PROVIDER_SITE_OTHER): Payer: BLUE CROSS/BLUE SHIELD | Admitting: Neurology

## 2018-03-22 VITALS — BP 164/93 | HR 64 | Ht 67.0 in | Wt 239.0 lb

## 2018-03-22 DIAGNOSIS — G2581 Restless legs syndrome: Secondary | ICD-10-CM

## 2018-03-22 DIAGNOSIS — E669 Obesity, unspecified: Secondary | ICD-10-CM | POA: Diagnosis not present

## 2018-03-22 DIAGNOSIS — G4719 Other hypersomnia: Secondary | ICD-10-CM

## 2018-03-22 DIAGNOSIS — Z955 Presence of coronary angioplasty implant and graft: Secondary | ICD-10-CM | POA: Diagnosis not present

## 2018-03-22 DIAGNOSIS — J4520 Mild intermittent asthma, uncomplicated: Secondary | ICD-10-CM | POA: Diagnosis not present

## 2018-03-22 DIAGNOSIS — R0683 Snoring: Secondary | ICD-10-CM | POA: Diagnosis not present

## 2018-03-22 DIAGNOSIS — R51 Headache: Secondary | ICD-10-CM

## 2018-03-22 DIAGNOSIS — I2510 Atherosclerotic heart disease of native coronary artery without angina pectoris: Secondary | ICD-10-CM

## 2018-03-22 DIAGNOSIS — R519 Headache, unspecified: Secondary | ICD-10-CM

## 2018-03-22 NOTE — Progress Notes (Signed)
Subjective:    Patient ID: Sharon Buck is a 62 y.o. female.  HPI     Star Age, MD, PhD [facility] [address]O. [place], [place] [address]  Dear [name],   I saw your patient, Sharon Buck, upon your kind request, in my neurologic clinic today for initial consultation of her sleep disorder, in particular, concern for underlying obstructive sleep apnea. The patient is unaccompanied today. As you know, Sharon Buck is a 62 year old right-handed woman with an underlying medical history of asthma, coronary artery disease with status post stent placement in April 2019, restless leg syndrome, hypertension and obesity, who reports snoring and excessive daytime somnolence. I reviewed your office note from 01/21/2018, which you kindly included. Her Epworth sleepiness score is 9 out of 24, fatigue score is 3663. She is single and lives alone. She has 2 children. She is a nonsmoker and drinks alcohol about once a week on average, drinks caffeine about once daily in the form of diet soda. Her weight has been fluctuating a little bit but generally is stable. Her son lives in [place], her daughter lives locally. Patient raised her granddaughter until she was 31, her granddaughter is getting ready to move back in, age 73. Patient reports a family history of snoring and congestive heart failure on her father's side. She has no nighttime had nocturia but has had morning headaches in the past. She has asthma since childhood, generally under good control with as needed use of Ventolin. Bedtime is around 11 and rise time around 7. She has 2 dogs and one cat in the household. She does not watch TV in bed. She has a stressful job, sometimes works 11 hours. She does endorse daytime tiredness and somnolence at times.  Her Past Medical History Is Significant For: Past Medical History:  Diagnosis Date  . Asthma   . Hypertension     Her Past Surgical History Is Significant  For: Past Surgical History:  Procedure Laterality Date  . ABDOMINAL HYSTERECTOMY    . CHOLECYSTECTOMY    . CORONARY STENT INTERVENTION N/A 01/14/2018   Procedure: CORONARY STENT INTERVENTION;  Surgeon: Nigel Mormon, MD;  Location: [facility];  Service: Cardiovascular;  Laterality: N/A;  . INTRAVASCULAR PRESSURE WIRE/FFR STUDY N/A 01/14/2018   Procedure: INTRAVASCULAR PRESSURE WIRE/FFR STUDY;  Surgeon: Nigel Mormon, MD;  Location: [facility];  Service: Cardiovascular;  Laterality: N/A;  . LEFT HEART CATH AND CORONARY ANGIOGRAPHY N/A 01/14/2018   Procedure: LEFT HEART CATH AND CORONARY ANGIOGRAPHY;  Surgeon: Nigel Mormon, MD;  Location: [facility];  Service: Cardiovascular;  Laterality: N/A;  . TUBAL LIGATION      Her Family History Is Significant For: No family history on file.  Her Social History Is Significant For: Social History   Socioeconomic History  . Marital status: Divorced    Spouse name: Not on file  . Number of children: Not on file  . Years of education: Not on file  . Highest education level: Not on file  Occupational History  . Not on file  Social Needs  . Financial resource strain: Not on file  . Food insecurity:    Worry: Not on file    Inability: Not on file  . Transportation needs:    Medical: Not on file    Non-medical: Not on file  Tobacco Use  . Smoking status: Never Smoker  . Smokeless tobacco: Never Used  Substance and  Sexual Activity  . Alcohol use: Yes  . Drug use: No  . Sexual activity: Never  Lifestyle  . Physical activity:    Days per week: Not on file    Minutes per session: Not on file  . Stress: Not on file  Relationships  . Social connections:    Talks on phone: Not on file    Gets together: Not on file    Attends religious service: Not on file    Active member of club or organization: Not on file    Attends meetings of clubs or organizations: Not on file    Relationship status: Not on  file  Other Topics Concern  . Not on file  Social History Narrative  . Not on file    Her Allergies Are:  Allergies  Allergen Reactions  . Prednisone Palpitations and Rash    Makes my heart race  :   Her Current Medications Are:  Outpatient Encounter Medications as of 03/22/2018  Medication Sig  . albuterol (PROVENTIL HFA;VENTOLIN HFA) 108 (90 BASE) MCG/ACT inhaler Inhale 2 puffs into the lungs every 6 (six) hours as needed for wheezing or shortness of breath.  Marland Kitchen amLODipine (NORVASC) 10 MG tablet Take 10 mg by mouth daily.  Marland Kitchen aspirin (ASPIRIN CHILDRENS) 81 MG chewable tablet Chew 1 tablet (81 mg total) by mouth daily.  . clopidogrel (PLAVIX) 75 MG tablet Take 1 tablet (75 mg total) by mouth daily.  . DULoxetine (CYMBALTA) 30 MG capsule Take 90 mg by mouth daily with lunch.  . fluticasone (FLONASE) 50 MCG/ACT nasal spray Place 2 sprays into the nose daily as needed for allergies.   . furosemide (LASIX) 40 MG tablet Take 80 mg by mouth daily with lunch.   . nitroGLYCERIN (NITROSTAT) 0.4 MG SL tablet Place 0.4 mg under the tongue every 5 (five) minutes x 3 doses as needed for chest pain.  . pramipexole (MIRAPEX) 0.25 MG tablet Take 0.25 mg by mouth at bedtime. *2 hours before bedtime*  . rosuvastatin (CRESTOR) 20 MG tablet Take 20 mg by mouth every evening.  . [DISCONTINUED] amLODipine (NORVASC) 5 MG tablet Take 10 mg by mouth daily with lunch.   No facility-administered encounter medications on file as of 03/22/2018.   :  Review of Systems:  Out of a complete 14 point review of systems, all are reviewed and negative with the exception of these symptoms as listed below: Review of Systems  Neurological:       Pt presents today to discuss her sleep. Pt has never had a sleep study but does endorse snoring.  Epworth Sleepiness Scale 0= would never doze 1= slight chance of dozing 2= moderate chance of dozing 3= high chance of dozing  Sitting and reading: 3 Watching TV: 3 Sitting  inactive in a public place (ex. Theater or meeting): 0 As a passenger in a car for an hour without a break: 1 Lying down to rest in the afternoon: 1 Sitting and talking to someone: 0 Sitting quietly after lunch (no alcohol): 1 In a car, while stopped in traffic: 0 Total: 9     Objective:  Neurological Exam  Physical Exam Physical Examination:   Vitals:   03/22/18 1526  BP: (!) 164/93  Pulse: 64    General Examination: The patient is a very pleasant 62 y.o. female in no acute distress. She appears well-developed and well-nourished and well groomed.   HEENT: Normocephalic, atraumatic, pupils are equal, round and reactive to light and accommodation. She  wears corrective eyeglasses. Extraocular tracking is good without limitation to gaze excursion or nystagmus noted. Normal smooth pursuit is noted. Hearing is grossly intact. Face is symmetric with normal facial animation and normal facial sensation. Speech is clear with no dysarthria noted. There is no hypophonia. There is no lip, neck/head, jaw or voice tremor. Neck is supple with full range of passive and active motion. There are no carotid bruits on auscultation. Oropharynx exam reveals: mild mouth dryness, adequate dental hygiene with partial plate on top and bottom, moderate airway crowding secondary to thicker soft palate, larger uvula, thicker tongue and tonsils of 2+. Mallampati is class III. Tongue protrudes centrally and palate elevates symmetrically. Neck size is 16.5 inches. She has a Mild overbite.  Chest: Clear to auscultation without wheezing, rhonchi or crackles noted.  Heart: S1+S2+0, regular and normal without murmurs, rubs or gallops noted.   Abdomen: Soft, non-tender and non-distended with normal bowel sounds appreciated on auscultation.  Extremities: There is no pitting edema in the distal lower extremities bilaterally. Pedal pulses are intact.  Skin: Warm and dry without trophic changes noted.  Musculoskeletal:  exam reveals no obvious joint deformities, tenderness or joint swelling or erythema.   Neurologically:  Mental status: The patient is awake, alert and oriented in all 4 spheres. Her immediate and remote memory, attention, language skills and fund of knowledge are appropriate. There is no evidence of aphasia, agnosia, apraxia or anomia. Speech is clear with normal prosody and enunciation. Thought process is linear. Mood is normal and affect is normal.  Cranial nerves II - XII are as described above under HEENT exam. In addition: shoulder shrug is normal with equal shoulder height noted. Motor exam: Normal bulk, strength and tone is noted. There is no drift, tremor or rebound. Romberg is negative. Fine motor skills and coordination: intact with normal finger taps, normal hand movements, normal rapid alternating patting, normal foot taps and normal foot agility.  Cerebellar testing: No dysmetria or intention tremor on finger to nose testing. Heel to shin is unremarkable bilaterally. There is no truncal or gait ataxia.  Sensory exam: intact to light touch in the upper and lower extremities.  Gait, station and balance: She stands easily. No veering to one side is noted. No leaning to one side is noted. Posture is age-appropriate and stance is narrow based. Gait shows normal stride length and normal pace. No problems turning are noted. Tandem walk is unremarkable.  Assessment and Plan:  In summary, Elvena Oyer Novick is a very pleasant 62 y.o.-year old female with an underlying medical history of asthma, coronary artery disease with status post stent placement in April 2019, restless leg syndrome, hypertension and obesity, whose history and physical exam are concerning for obstructive sleep apnea (OSA). I had a long chat with the patient about my findings and the diagnosis of OSA, its prognosis and treatment options. We talked about medical treatments, surgical interventions and non-pharmacological approaches. I  explained in particular the risks and ramifications of untreated moderate to severe OSA, especially with respect to developing cardiovascular disease down the Road, including congestive heart failure, difficult to treat hypertension, cardiac arrhythmias, or stroke. Even type 2 diabetes has, in part, been linked to untreated OSA. Symptoms of untreated OSA include daytime sleepiness, memory problems, mood irritability and mood disorder such as depression and anxiety, lack of energy, as well as recurrent headaches, especially morning headaches. We talked about trying to maintain a healthy lifestyle in general, as well as the importance of weight control.  I encouraged the patient to eat healthy, exercise daily and keep well hydrated, to keep a scheduled bedtime and wake time routine, to not skip any meals and eat healthy snacks in between meals. I advised the patient not to drive when feeling sleepy. I recommended the following at this time: sleep study with potential positive airway pressure titration. (We will score hypopneas at 3%).   I explained the sleep test procedure to the patient and also outlined possible surgical and non-surgical treatment options of OSA, including the use of a custom-made dental device (which would require a referral to a specialist dentist or oral surgeon), upper airway surgical options, such as pillar implants, radiofrequency surgery, tongue base surgery, and UPPP (which would involve a referral to an ENT surgeon). Rarely, jaw surgery such as mandibular advancement [place] be considered.  I also explained the CPAP treatment option to the patient, who indicated that she would be willing to try CPAP if the need arises. I explained the importance of being compliant with PAP treatment, not only for insurance purposes but primarily to improve Her symptoms, and for the patient's long term health benefit, including to reduce Her cardiovascular risks.  I answered all her questions today and the  patient was in agreement. I plan to see her back after the sleep study is completed and encouraged her to call with any interim questions, concerns, problems or updates.   Thank you very much for allowing me to participate in the care of this nice patient. If I can be of any further assistance to you please do not hesitate to call me at [phone].  Sincerely,   Star Age, MD, PhD

## 2018-03-22 NOTE — Patient Instructions (Signed)
Thank you for choosing [facility] for your sleep related care! It was nice to meet you today! I appreciate that you entrust me with your sleep related healthcare concerns. I hope, I was able to address at least some of your concerns today, and that I can help you feel reassured and also get better.    Here is what we discussed today and what we came up with as our plan for you:    Based on your symptoms and your exam I believe you are at risk for obstructive sleep apnea (aka OSA), and I think we should proceed with a sleep study to determine whether you do or do not have OSA and how severe it is. Even, if you have mild OSA, I Hoerner want you to consider treatment with CPAP, as treatment of even borderline or mild sleep apnea can result and improvement of symptoms such as sleep disruption, daytime sleepiness, nighttime bathroom breaks, restless leg symptoms, improvement of headache syndromes, even improved mood disorder.   Please remember, the long-term risks and ramifications of untreated moderate to severe obstructive sleep apnea are: increased Cardiovascular disease, including congestive heart failure, stroke, difficult to control hypertension, treatment resistant obesity, arrhythmias, especially irregular heartbeat commonly known as A. Fib. (atrial fibrillation); even type 2 diabetes has been linked to untreated OSA.   Sleep apnea can cause disruption of sleep and sleep deprivation in most cases, which, in turn, can cause recurrent headaches, problems with memory, mood, concentration, focus, and vigilance. Most people with untreated sleep apnea report excessive daytime sleepiness, which can affect their ability to drive. Please do not drive if you feel sleepy. Patients with sleep apnea developed difficulty initiating and maintaining sleep (aka insomnia).   Having sleep apnea [place] increase your risk for other sleep disorders, including involuntary behaviors sleep such as sleep terrors,  sleep talking, sleepwalking.    Having sleep apnea can also increase your risk for restless leg syndrome and leg movements at night.   Please note that untreated obstructive sleep apnea [place] carry additional perioperative morbidity. Patients with significant obstructive sleep apnea (typically, in the moderate to severe degree) should receive, if possible, perioperative PAP (positive airway pressure) therapy and the surgeons and particularly the anesthesiologists should be informed of the diagnosis and the severity of the sleep disordered breathing.   I will likely see you back after your sleep study to go over the test results and where to go from there. We will call you after your sleep study to advise about the results (most likely, you will hear from Kristen, my nurse) and to set up an appointment at the time, as necessary.    Our sleep lab administrative assistant will call you to schedule your sleep study and give you further instructions, regarding the check in process for the sleep study, arrival time, what to bring, when you can expect to leave after the study, etc., and to answer any other logistical questions you [place] have. If you don't hear back from her by about 2 weeks from now, please feel free to call her direct line at [phone] or you can call our general clinic number, or email us through My Chart.   

## 2018-04-27 DIAGNOSIS — I25118 Atherosclerotic heart disease of native coronary artery with other forms of angina pectoris: Secondary | ICD-10-CM | POA: Diagnosis not present

## 2018-04-27 DIAGNOSIS — I10 Essential (primary) hypertension: Secondary | ICD-10-CM | POA: Diagnosis not present

## 2018-04-27 DIAGNOSIS — E1151 Type 2 diabetes mellitus with diabetic peripheral angiopathy without gangrene: Secondary | ICD-10-CM | POA: Diagnosis not present

## 2018-04-27 DIAGNOSIS — R5383 Other fatigue: Secondary | ICD-10-CM | POA: Diagnosis not present

## 2018-05-05 ENCOUNTER — Telehealth: Payer: Self-pay

## 2018-05-05 DIAGNOSIS — Z955 Presence of coronary angioplasty implant and graft: Secondary | ICD-10-CM

## 2018-05-05 DIAGNOSIS — R0683 Snoring: Secondary | ICD-10-CM

## 2018-05-05 NOTE — Telephone Encounter (Signed)
Insurance has denied in lab sleep study request. Do you want to order HST instead?

## 2018-05-05 NOTE — Telephone Encounter (Signed)
VO for HST from Dr. Athar received. HST order placed.  

## 2018-05-05 NOTE — Addendum Note (Signed)
Addended by: Lester [place] A on: 05/05/2018 11:19 AM   Modules accepted: Orders

## 2018-05-23 ENCOUNTER — Ambulatory Visit (INDEPENDENT_AMBULATORY_CARE_PROVIDER_SITE_OTHER): Payer: BLUE CROSS/BLUE SHIELD | Admitting: Neurology

## 2018-05-23 DIAGNOSIS — Z955 Presence of coronary angioplasty implant and graft: Secondary | ICD-10-CM

## 2018-05-23 DIAGNOSIS — R0683 Snoring: Secondary | ICD-10-CM

## 2018-05-23 DIAGNOSIS — G4733 Obstructive sleep apnea (adult) (pediatric): Secondary | ICD-10-CM

## 2018-05-30 ENCOUNTER — Telehealth: Payer: Self-pay

## 2018-05-30 NOTE — Telephone Encounter (Signed)
I called pt and explained Dr. Guadelupe Sabin recommendations to her. Pt reports that she will think about this further and call me back with her decision. Pt verbalized understanding of results. Pt had no questions at this time but was encouraged to call back if questions arise.

## 2018-05-30 NOTE — Telephone Encounter (Signed)
I called pt and discussed her sleep study results with her. She is very reluctant to try PAP therapy. She reports that she could hardly tolerate the HST and slept very poorly that night. She is asking if there is any alternative for her osa  tx.

## 2018-05-30 NOTE — Telephone Encounter (Signed)
For moderate OSA, treatment with CPAP or AutoPAP is the best treatment option. Alternative option [place] be a dental device. She can talk to her dentist or we can make a referral if needed. She is also advised to work on weight loss as this will help reduce the severity of her OSA. Especially in light of her cardiac Hx, treatment of her OSA is highly recommended and most efficiently and quickest treated with AutoPAP or CPAP. Pls re-iterate to pt. Please also keep in mind that some insurances don't cover the dental device unless CPAP was tried first.

## 2018-05-30 NOTE — Addendum Note (Signed)
Addended by: Star Age on: 05/30/2018 08:21 AM   Modules accepted: Orders

## 2018-05-30 NOTE — Procedures (Signed)
[facility] [place], [place] [address] NAME:  Sharon Buck                                                                DOB: 1956-05-21 MEDICAL RECORD NUMBER [id number]                                             DOS:  05/24/2018 REFERRING PHYSICIAN: Binnie Kand, NP STUDY PERFORMED: Home Sleep test  HISTORY: 62 year old woman with a history of asthma, coronary artery disease with status post stent placement in April 2019, restless leg syndrome, hypertension and obesity, who reports snoring and excessive daytime somnolence. Her Epworth sleepiness score is 9 out of 24, BMI: 37.4.  STUDY RESULTS:  Total Recording Time: 6 hours, 58 minutes (valid test time: 5 hours, 40 min) Total Apnea/Hypopnea Index (AHI):  18.2/h, RDI:  20.7 /h Average Oxygen Saturation:   91%, Lowest Oxygen Desaturation: 83%  Total Time Oxygen Saturation Below or at 88%: 44 minutes (12%) Average Heart Rate:   56 bpm (between 47 and 107 bpm) IMPRESSION: OSA RECOMMENDATION: This home sleep test demonstrates moderate obstructive sleep apnea with a total AHI of 18.2/hour and O2 nadir of 83%. Given the patient's medical history and sleep related complaints, treatment with positive airway pressure (in the form of CPAP) is recommended. This will require a full night CPAP titration study for proper treatment settings, O2 monitoring and mask fitting. Based on the severity of the sleep disordered breathing an attended titration study is indicated. However, patient's insurance has denied an attended sleep study; therefore, the patient will be advised to proceed with an autoPAP titration/trial at home for now. Please note, that untreated obstructive sleep apnea [place] carry additional perioperative morbidity. Patients with significant obstructive sleep apnea should receive perioperative PAP therapy and the surgeons and particularly the anesthesiologist should be informed of the diagnosis and the  severity of the sleep disordered breathing. The patient should be cautioned not to drive, work at heights, or operate dangerous or heavy equipment when tired or sleepy. Review and reiteration of good sleep hygiene measures should be pursued with any patient. Other causes of the patient's symptoms, including circadian rhythm disturbances, an underlying mood disorder, medication effect and/or an underlying medical problem cannot be ruled out based on this test. Clinical correlation is recommended. The patient and his referring provider will be notified of the test results. The patient will be seen in follow up in sleep clinic at [facility].  I certify that I have reviewed the raw data recording prior to the issuance of this report in accordance with the standards of the American Academy of Sleep Medicine (AASM).  Star Age, MD, PhD [facility]) Diplomat, ABPN (Neurology and Sleep)

## 2018-05-30 NOTE — Telephone Encounter (Signed)
I called pt to discuss her sleep study results. No answer, left a message asking her to call me back. 

## 2018-05-30 NOTE — Progress Notes (Signed)
Patient referred by cardiology, seen by me on 6/11//19, HST on 05/24/18.    Please call and notify the patient that the recent home sleep test showed obstructive sleep apnea in the moderate range. While I recommend treatment for this in the form CPAP, her insurance will not approve a sleep study for this. They will likely only approve a trial of autoPAP, which means, that we don't have to bring her in for a sleep study with CPAP, but will let her try an autoPAP machine at home, through a DME company (of her choice, or as per insurance requirement). The DME representative will educate her on how to use the machine, how to put the mask on, etc. I have placed an order in the chart. Please send referral, talk to patient, send report to referring MD. We will need a FU in sleep clinic for 10 weeks post-PAP set up, please arrange that with me or one of our NPs. Thanks,   Star Age, MD, PhD [facility])

## 2018-05-30 NOTE — Telephone Encounter (Signed)
-----   Message from Star Age, MD sent at 05/30/2018  8:21 AM EDT ----- Patient referred by cardiology, seen by me on 6/11//19, HST on 05/24/18.    Please call and notify the patient that the recent home sleep test showed obstructive sleep apnea in the moderate range. While I recommend treatment for this in the form CPAP, her insurance will not approve a sleep study for this. They will likely only approve a trial of autoPAP, which means, that we don't have to bring her in for a sleep study with CPAP, but will let her try an autoPAP machine at home, through a DME company (of her choice, or as per insurance requirement). The DME representative will educate her on how to use the machine, how to put the mask on, etc. I have placed an order in the chart. Please send referral, talk to patient, send report to referring MD. We will need a FU in sleep clinic for 10 weeks post-PAP set up, please arrange that with me or one of our NPs. Thanks,   Star Age, MD, PhD [facility])

## 2018-07-25 DIAGNOSIS — E7800 Pure hypercholesterolemia, unspecified: Secondary | ICD-10-CM | POA: Diagnosis not present

## 2018-07-25 DIAGNOSIS — R0609 Other forms of dyspnea: Secondary | ICD-10-CM | POA: Diagnosis not present

## 2018-07-25 DIAGNOSIS — I2510 Atherosclerotic heart disease of native coronary artery without angina pectoris: Secondary | ICD-10-CM | POA: Diagnosis not present

## 2018-07-25 DIAGNOSIS — I10 Essential (primary) hypertension: Secondary | ICD-10-CM | POA: Diagnosis not present

## 2018-08-04 DIAGNOSIS — I2510 Atherosclerotic heart disease of native coronary artery without angina pectoris: Secondary | ICD-10-CM | POA: Diagnosis not present

## 2018-08-04 DIAGNOSIS — I10 Essential (primary) hypertension: Secondary | ICD-10-CM | POA: Diagnosis not present

## 2018-08-04 DIAGNOSIS — Z1231 Encounter for screening mammogram for malignant neoplasm of breast: Secondary | ICD-10-CM | POA: Diagnosis not present

## 2018-08-04 DIAGNOSIS — R5381 Other malaise: Secondary | ICD-10-CM | POA: Diagnosis not present

## 2018-08-22 DIAGNOSIS — I25119 Atherosclerotic heart disease of native coronary artery with unspecified angina pectoris: Secondary | ICD-10-CM | POA: Diagnosis not present

## 2018-08-22 DIAGNOSIS — E7800 Pure hypercholesterolemia, unspecified: Secondary | ICD-10-CM | POA: Diagnosis not present

## 2018-08-22 DIAGNOSIS — R0609 Other forms of dyspnea: Secondary | ICD-10-CM | POA: Diagnosis not present

## 2018-08-22 DIAGNOSIS — I10 Essential (primary) hypertension: Secondary | ICD-10-CM | POA: Diagnosis not present

## 2018-09-19 DIAGNOSIS — R079 Chest pain, unspecified: Secondary | ICD-10-CM | POA: Diagnosis not present

## 2018-09-19 DIAGNOSIS — I10 Essential (primary) hypertension: Secondary | ICD-10-CM | POA: Diagnosis not present

## 2018-09-19 DIAGNOSIS — I25118 Atherosclerotic heart disease of native coronary artery with other forms of angina pectoris: Secondary | ICD-10-CM | POA: Diagnosis not present

## 2018-09-19 DIAGNOSIS — E7849 Other hyperlipidemia: Secondary | ICD-10-CM | POA: Diagnosis not present

## 2018-09-20 DIAGNOSIS — I25119 Atherosclerotic heart disease of native coronary artery with unspecified angina pectoris: Secondary | ICD-10-CM | POA: Diagnosis not present

## 2018-09-20 DIAGNOSIS — E7800 Pure hypercholesterolemia, unspecified: Secondary | ICD-10-CM | POA: Diagnosis not present

## 2018-09-20 DIAGNOSIS — I10 Essential (primary) hypertension: Secondary | ICD-10-CM | POA: Diagnosis not present

## 2018-09-20 DIAGNOSIS — R0609 Other forms of dyspnea: Secondary | ICD-10-CM | POA: Diagnosis not present

## 2018-09-21 NOTE — Telephone Encounter (Signed)
Received notice from pt's visit with Dr. Dagmar Hait at [facility] today that he recommends that pt start a cpap and asked our office to call pt.  I called pt to discuss. No answer, left a message asking her to call me back.

## 2018-09-27 DIAGNOSIS — I25119 Atherosclerotic heart disease of native coronary artery with unspecified angina pectoris: Secondary | ICD-10-CM | POA: Diagnosis not present

## 2018-09-30 ENCOUNTER — Encounter (HOSPITAL_COMMUNITY)
Admission: RE | Disposition: A | Discharge status: Home / Self Care | Payer: Self-pay | Source: Ambulatory Visit | Attending: Cardiology

## 2018-09-30 ENCOUNTER — Ambulatory Visit (HOSPITAL_COMMUNITY)
Admission: RE | Admit: 2018-09-30 | Discharge: 2018-10-01 | Disposition: A | Discharge status: Home / Self Care | Payer: BLUE CROSS/BLUE SHIELD | Source: Ambulatory Visit | Attending: Cardiology | Admitting: Cardiology

## 2018-09-30 ENCOUNTER — Other Ambulatory Visit: Payer: Self-pay

## 2018-09-30 ENCOUNTER — Encounter (HOSPITAL_COMMUNITY): Payer: Self-pay | Admitting: *Deleted

## 2018-09-30 DIAGNOSIS — Z9861 Coronary angioplasty status: Secondary | ICD-10-CM

## 2018-09-30 DIAGNOSIS — Z90710 Acquired absence of both cervix and uterus: Secondary | ICD-10-CM | POA: Diagnosis not present

## 2018-09-30 DIAGNOSIS — I2584 Coronary atherosclerosis due to calcified coronary lesion: Secondary | ICD-10-CM | POA: Insufficient documentation

## 2018-09-30 DIAGNOSIS — Z955 Presence of coronary angioplasty implant and graft: Secondary | ICD-10-CM | POA: Diagnosis not present

## 2018-09-30 DIAGNOSIS — Z888 Allergy status to other drugs, medicaments and biological substances status: Secondary | ICD-10-CM | POA: Diagnosis not present

## 2018-09-30 DIAGNOSIS — E785 Hyperlipidemia, unspecified: Secondary | ICD-10-CM | POA: Diagnosis not present

## 2018-09-30 DIAGNOSIS — I25110 Atherosclerotic heart disease of native coronary artery with unstable angina pectoris: Secondary | ICD-10-CM | POA: Diagnosis not present

## 2018-09-30 DIAGNOSIS — I209 Angina pectoris, unspecified: Secondary | ICD-10-CM | POA: Diagnosis present

## 2018-09-30 DIAGNOSIS — Z7902 Long term (current) use of antithrombotics/antiplatelets: Secondary | ICD-10-CM | POA: Diagnosis not present

## 2018-09-30 DIAGNOSIS — R0609 Other forms of dyspnea: Secondary | ICD-10-CM | POA: Diagnosis not present

## 2018-09-30 DIAGNOSIS — J45909 Unspecified asthma, uncomplicated: Secondary | ICD-10-CM | POA: Insufficient documentation

## 2018-09-30 DIAGNOSIS — I10 Essential (primary) hypertension: Secondary | ICD-10-CM | POA: Diagnosis not present

## 2018-09-30 DIAGNOSIS — I2510 Atherosclerotic heart disease of native coronary artery without angina pectoris: Secondary | ICD-10-CM

## 2018-09-30 DIAGNOSIS — Z79899 Other long term (current) drug therapy: Secondary | ICD-10-CM | POA: Insufficient documentation

## 2018-09-30 DIAGNOSIS — I208 Other forms of angina pectoris: Secondary | ICD-10-CM | POA: Diagnosis not present

## 2018-09-30 DIAGNOSIS — I25118 Atherosclerotic heart disease of native coronary artery with other forms of angina pectoris: Secondary | ICD-10-CM | POA: Diagnosis not present

## 2018-09-30 HISTORY — PX: INTRAVASCULAR ULTRASOUND/IVUS: CATH118244

## 2018-09-30 HISTORY — PX: LEFT HEART CATH AND CORONARY ANGIOGRAPHY: CATH118249

## 2018-09-30 HISTORY — PX: CORONARY BALLOON ANGIOPLASTY: CATH118233

## 2018-09-30 LAB — CBC
HCT: 39.4 % (ref 36.0–46.0)
HEMOGLOBIN: 12.7 g/dL (ref 12.0–15.0)
MCH: 29.5 pg (ref 26.0–34.0)
MCHC: 32.2 g/dL (ref 30.0–36.0)
MCV: 91.4 fL (ref 80.0–100.0)
Platelets: 176 10*3/uL (ref 150–400)
RBC: 4.31 MIL/uL (ref 3.87–5.11)
RDW: 12.1 % (ref 11.5–15.5)
WBC: 7.4 10*3/uL (ref 4.0–10.5)
nRBC: 0 % (ref 0.0–0.2)

## 2018-09-30 LAB — POCT ACTIVATED CLOTTING TIME
Activated Clotting Time: 252 seconds
Activated Clotting Time: 263 seconds
Activated Clotting Time: 384 seconds

## 2018-09-30 LAB — CREATININE, SERUM
Creatinine, Ser: 1.02 mg/dL — ABNORMAL HIGH (ref 0.44–1.00)
GFR calc Af Amer: >60 mL/min (ref >60)
GFR calc non Af Amer: 59 mL/min — ABNORMAL LOW (ref >60)

## 2018-09-30 SURGERY — LEFT HEART CATH AND CORONARY ANGIOGRAPHY
Anesthesia: LOCAL

## 2018-09-30 MED ORDER — HYDRALAZINE HCL 25 MG PO TABS
25.0000 mg | ORAL_TABLET | Freq: Three times a day (TID) | ORAL | Status: DC
  Administered 2018-09-30 – 2018-10-01 (×2): 25 mg via ORAL
  Filled 2018-09-30 (×2): qty 1

## 2018-09-30 MED ORDER — FENTANYL CITRATE (PF) 100 MCG/2ML IJ SOLN
INTRAMUSCULAR | Status: DC | PRN
  Administered 2018-09-30: 50 ug via INTRAVENOUS

## 2018-09-30 MED ORDER — HEPARIN SODIUM (PORCINE) 1000 UNIT/ML IJ SOLN
INTRAMUSCULAR | Status: AC
  Filled 2018-09-30: qty 1

## 2018-09-30 MED ORDER — HEPARIN SODIUM (PORCINE) 5000 UNIT/ML IJ SOLN
5000.0000 [IU] | Freq: Three times a day (TID) | INTRAMUSCULAR | Status: DC
  Administered 2018-10-01: 07:00:00 5000 [IU] via SUBCUTANEOUS
  Filled 2018-09-30: qty 1

## 2018-09-30 MED ORDER — AMLODIPINE BESYLATE 10 MG PO TABS
10.0000 mg | ORAL_TABLET | Freq: Every day | ORAL | Status: DC
  Administered 2018-10-01: 09:00:00 10 mg via ORAL
  Filled 2018-09-30: qty 1

## 2018-09-30 MED ORDER — ROSUVASTATIN CALCIUM 20 MG PO TABS: 20.0000 mg | ORAL_TABLET | Freq: Every day | ORAL | Status: DC

## 2018-09-30 MED ORDER — SODIUM CHLORIDE 0.9 % IV SOLN: 250.0000 mL | INTRAVENOUS | Status: DC | PRN

## 2018-09-30 MED ORDER — SODIUM CHLORIDE 0.9 % IV SOLN
INTRAVENOUS | Status: DC
  Administered 2018-09-30: 11:00:00 via INTRAVENOUS

## 2018-09-30 MED ORDER — ALBUTEROL SULFATE (2.5 MG/3ML) 0.083% IN NEBU: 3.0000 mL | INHALATION_SOLUTION | Freq: Four times a day (QID) | RESPIRATORY_TRACT | Status: DC | PRN

## 2018-09-30 MED ORDER — NITROGLYCERIN IN D5W 200-5 MCG/ML-% IV SOLN
10.0000 ug/min | INTRAVENOUS | Status: DC
  Administered 2018-09-30: 10 ug/min via INTRAVENOUS

## 2018-09-30 MED ORDER — ACETAMINOPHEN 325 MG PO TABS
650.0000 mg | ORAL_TABLET | ORAL | Status: DC | PRN
  Administered 2018-09-30: 18:00:00 650 mg via ORAL
  Filled 2018-09-30: qty 2

## 2018-09-30 MED ORDER — ANGIOPLASTY BOOK
Freq: Once | Status: AC
  Administered 2018-10-01: 07:00:00
  Filled 2018-09-30: qty 1

## 2018-09-30 MED ORDER — VERAPAMIL HCL 2.5 MG/ML IV SOLN
INTRAVENOUS | Status: DC | PRN
  Administered 2018-09-30: 10 mL via INTRA_ARTERIAL

## 2018-09-30 MED ORDER — FENTANYL CITRATE (PF) 100 MCG/2ML IJ SOLN
INTRAMUSCULAR | Status: AC
  Filled 2018-09-30: qty 2

## 2018-09-30 MED ORDER — HYDRALAZINE HCL 20 MG/ML IJ SOLN: 5.0000 mg | INTRAMUSCULAR | Status: AC | PRN

## 2018-09-30 MED ORDER — HEPARIN (PORCINE) IN NACL 1000-0.9 UT/500ML-% IV SOLN
INTRAVENOUS | Status: DC | PRN
  Administered 2018-09-30 (×2): 500 mL

## 2018-09-30 MED ORDER — DULOXETINE HCL 60 MG PO CPEP
90.0000 mg | ORAL_CAPSULE | Freq: Every day | ORAL | Status: DC
  Filled 2018-09-30: qty 1

## 2018-09-30 MED ORDER — HEPARIN (PORCINE) IN NACL 1000-0.9 UT/500ML-% IV SOLN
INTRAVENOUS | Status: AC
  Filled 2018-09-30: qty 1000

## 2018-09-30 MED ORDER — LIDOCAINE HCL (PF) 1 % IJ SOLN
INTRAMUSCULAR | Status: AC
  Filled 2018-09-30: qty 30

## 2018-09-30 MED ORDER — NITROGLYCERIN 0.4 MG SL SUBL: 0.4000 mg | SUBLINGUAL_TABLET | SUBLINGUAL | Status: DC | PRN

## 2018-09-30 MED ORDER — NITROGLYCERIN 1 MG/10 ML FOR IR/CATH LAB
INTRA_ARTERIAL | Status: DC | PRN
  Administered 2018-09-30 (×5): 100 ug via INTRACORONARY

## 2018-09-30 MED ORDER — ASPIRIN 81 MG PO CHEW
81.0000 mg | CHEWABLE_TABLET | Freq: Every day | ORAL | Status: DC
  Administered 2018-10-01: 81 mg via ORAL
  Filled 2018-09-30: qty 1

## 2018-09-30 MED ORDER — ATORVASTATIN CALCIUM 80 MG PO TABS
80.0000 mg | ORAL_TABLET | Freq: Every day | ORAL | Status: DC
  Administered 2018-09-30: 22:00:00 80 mg via ORAL
  Filled 2018-09-30: qty 1

## 2018-09-30 MED ORDER — MIDAZOLAM HCL 2 MG/2ML IJ SOLN
INTRAMUSCULAR | Status: DC | PRN
  Administered 2018-09-30: 1 mg via INTRAVENOUS

## 2018-09-30 MED ORDER — SODIUM CHLORIDE 0.9% FLUSH: 3.0000 mL | INTRAVENOUS | Status: DC | PRN

## 2018-09-30 MED ORDER — LABETALOL HCL 5 MG/ML IV SOLN: 10.0000 mg | INTRAVENOUS | Status: AC | PRN

## 2018-09-30 MED ORDER — MIDAZOLAM HCL 2 MG/2ML IJ SOLN
INTRAMUSCULAR | Status: AC
  Filled 2018-09-30: qty 2

## 2018-09-30 MED ORDER — NITROGLYCERIN IN D5W 200-5 MCG/ML-% IV SOLN
INTRAVENOUS | Status: AC
  Filled 2018-09-30: qty 250

## 2018-09-30 MED ORDER — FLUTICASONE PROPIONATE 50 MCG/ACT NA SUSP: 2.0000 | Freq: Every day | NASAL | Status: DC | PRN

## 2018-09-30 MED ORDER — SODIUM CHLORIDE 0.9 % IV SOLN
INTRAVENOUS | Status: AC
  Administered 2018-09-30: 18:00:00 via INTRAVENOUS

## 2018-09-30 MED ORDER — VERAPAMIL HCL 2.5 MG/ML IV SOLN
INTRAVENOUS | Status: AC
  Filled 2018-09-30: qty 2

## 2018-09-30 MED ORDER — ONDANSETRON HCL 4 MG/2ML IJ SOLN: 4.0000 mg | Freq: Four times a day (QID) | INTRAMUSCULAR | Status: DC | PRN

## 2018-09-30 MED ORDER — NITROGLYCERIN 1 MG/10 ML FOR IR/CATH LAB
INTRA_ARTERIAL | Status: AC
  Filled 2018-09-30: qty 10

## 2018-09-30 MED ORDER — SODIUM CHLORIDE 0.9% FLUSH: 3.0000 mL | Freq: Two times a day (BID) | INTRAVENOUS | Status: DC

## 2018-09-30 MED ORDER — ASPIRIN 81 MG PO CHEW: 81.0000 mg | CHEWABLE_TABLET | ORAL | Status: DC

## 2018-09-30 MED ORDER — LIDOCAINE HCL (PF) 1 % IJ SOLN
INTRAMUSCULAR | Status: DC | PRN
  Administered 2018-09-30: 2 mL via INTRADERMAL

## 2018-09-30 MED ORDER — IOHEXOL 350 MG/ML SOLN
INTRAVENOUS | Status: DC | PRN
  Administered 2018-09-30: 130 mL via INTRAVENOUS

## 2018-09-30 MED ORDER — PRAMIPEXOLE DIHYDROCHLORIDE 0.25 MG PO TABS
0.5000 mg | ORAL_TABLET | Freq: Every day | ORAL | Status: DC
  Administered 2018-09-30: 0.5 mg via ORAL
  Filled 2018-09-30: qty 2

## 2018-09-30 MED ORDER — HEPARIN SODIUM (PORCINE) 1000 UNIT/ML IJ SOLN
INTRAMUSCULAR | Status: DC | PRN
  Administered 2018-09-30 (×2): 6000 [IU] via INTRAVENOUS
  Administered 2018-09-30 (×2): 3000 [IU] via INTRAVENOUS

## 2018-09-30 SURGICAL SUPPLY — 18 items
BALLN SAPPHIRE ~~LOC~~ 3.5X15 (BALLOONS) ×1 IMPLANT
CATH 5FR JL3.5 JR4 ANG PIG MP (CATHETERS) ×1 IMPLANT
CATH LAUNCHER 6FR EBU3.5 (CATHETERS) ×1 IMPLANT
CATH OPTICROSS 40MHZ (CATHETERS) ×1 IMPLANT
DEVICE RAD COMP TR BAND LRG (VASCULAR PRODUCTS) ×1 IMPLANT
GLIDESHEATH SLEND SS 6F .021 (SHEATH) ×1 IMPLANT
GUIDEWIRE INQWIRE 1.5J.035X260 (WIRE) IMPLANT
INQWIRE 1.5J .035X260CM (WIRE) ×2
KIT ENCORE 26 ADVANTAGE (KITS) ×1 IMPLANT
KIT ESSENTIALS PG (KITS) ×1 IMPLANT
KIT HEART LEFT (KITS) ×2 IMPLANT
KIT HEMO VALVE WATCHDOG (MISCELLANEOUS) ×1 IMPLANT
PACK CARDIAC CATHETERIZATION (CUSTOM PROCEDURE TRAY) ×2 IMPLANT
SHEATH PROBE COVER 6X72 (BAG) ×1 IMPLANT
SLED PULL BACK IVUS (MISCELLANEOUS) ×1 IMPLANT
TRANSDUCER W/STOPCOCK (MISCELLANEOUS) ×2 IMPLANT
TUBING CIL FLEX 10 FLL-RA (TUBING) ×2 IMPLANT
WIRE MINAMO 190 (WIRE) ×1 IMPLANT

## 2018-09-30 NOTE — Interval H&P Note (Signed)
History and Physical Interval Note:  09/30/2018 11:56 AM  Sharon Buck  has presented today for surgery, with the diagnosis of angina  The various methods of treatment have been discussed with the patient and family. After consideration of risks, benefits and other options for treatment, the patient has consented to  Procedure(s): LEFT HEART CATH AND CORONARY ANGIOGRAPHY (N/A) as a surgical intervention .  The patient's history has been reviewed, patient examined, no change in status, stable for surgery.  I have reviewed the patient's chart and labs.  Questions were answered to the patient's satisfaction.    2016/2017 Appropriate Use Criteria for Coronary Revascularization Clinical Presentation: Diabetes Mellitus? Symptom Status? S/P CABG? Antianginal Therapy (# of long-acting drugs)? Results of Non-invasive testing? FFR/iFR results in all diseased vessels? Patient undergoing renal transplant? Patient undergoing percutaneous valve procedure (TAVR, MitraClip, Others)? Symptom Status:  Ischemic Symptoms  Non-invasive Testing:  Low risk  If no or indeterminate stress test, FFR/iFR results in all diseased vessels:  N/A  Diabetes Mellitus:  No  S/P CABG:  No  Antianginal therapy (number of long-acting drugs):  >=2  Patient undergoing renal transplant:  No  Patient undergoing percutaneous valve procedure:  No    newline 1 Vessel Disease PCI CABG  No proximal LAD involvement, No proximal left dominant LCX involvement A (7); Indication 1 M (5); Indication 1   Proximal left dominant LCX involvement A (7); Indication 4 A (7); Indication 4   Proximal LAD involvement A (7); Indication 4 A (7); Indication 4   newline 2 Vessel Disease  No proximal LAD involvement A (7); Indication 7 M (6); Indication 7   Proximal LAD involvement A (7); Indication 10 A (7); Indication 10   newline 3 Vessel Disease  Low disease complexity (e.g., focal stenoses, SYNTAX <=22) A (7); Indication 16 A (7); Indication  16   Intermediate or high disease complexity (e.g., SYNTAX >=23) M (6); Indication 20 A (8); Indication 20   newline Left Main Disease  Isolated LMCA disease: ostial or midshaft A (7); Indication 24 A (9); Indication 24   Isolated LMCA disease: bifurcation involvement M (6); Indication 25 A (9); Indication 25   LMCA ostial or midshaft, concurrent low disease burden multivessel disease (e.g., 1-2 additional focal stenoses, SYNTAX <=22) A (7); Indication 26 A (9); Indication 26   LMCA ostial or midshaft, concurrent intermediate or high disease burden multivessel disease (e.g., 1-2 additional bifurcation stenoses, long stenoses, SYNTAX >=23) M (4); Indication 27 A (9); Indication 27   LMCA bifurcation involvement, concurrent low disease burden multivessel disease (e.g., 1-2 additional focal stenoses, SYNTAX <=22) M (6); Indication 28 A (9); Indication 28   LMCA bifurcation involvement, concurrent intermediate or high disease burden multivessel disease (e.g., 1-2 additional bifurcation stenoses, long stenoses, SYNTAX >=23) R (3); Indication 29 A (9); Indication [place]

## 2018-09-30 NOTE — H&P (Signed)
Soua Lenk [place] Oct 18, 2018 3:30 PM Location: [facility] Patient #: [id number] DOB: 1956-06-12 Divorced / Language: Cleophus Molt / Race: White Female   History of Present Illness Gwinda Maine FNP-C; 09/21/2018 10:52 PM) Patient words: 2-3 week F/U for angina, htn, Last OV 08/22/18.  The patient is a 62 year old female who presents for a follow-up for Chest pain. Patient with past medical history of hypertension, hyperlipidemia, asthma, and coronary atherosclerosis by CT scan was initially referred to [place] for evaluation of chest pain concerning for angina pectoris athough had exercise nuclear stress test on 11/29/2017 that was considered low risk and echocardiogram on 12/07/2017 that showed mild LVH and diastolic dysfunction, otherwise normal echo. Although testing was negative, she continued to have nitroglycerin repsonsive chest pain concerning for angina, hence underwent coronary angiogram on 01/14/2018 that revealed stenosis to LAD and underwent stent placement at that time.  Patient was last seen by me 2 weeks ago. Due to continued anginal symptoms was started on Imdur. Has had slight improvement in headache and hot flashes since being on medication, but does continue to have exertional chest pain. She has also noticed for the last few days occasional episodes of chest heaviness throughout the day. Blood pressure has improved but continues to be mildly elevated. Has shortness of breath on exertion if she pushes herself. She has not used any nitroglycerin as she is not felt that her chest pain has been severe enough for this.      Problem List/Past Medical Juliann Pulse [place]; 10/18/18 3:21 PM) Asthma (J45.909)  Restless leg (G25.81)  Headache [08/05/2018]: Dizziness [08/05/2018]: Coronary artery calcification seen on CT scan (I25.10)  CTA chest 10/22/2017: Some calcific aortic including coronary atherosclerotic calcifications. Fatty infiltration of the liver. Benign essential  hypertension (I10)  EKG 07/25/2018: Normal sinus rhythm at 55 bpm, normal axis, no evidence of ischemia. Low-voltage complexes. Compared to EKG 01/21/2018, bradycardia is new. Hyperlipidemia, group A (E78.00)  Laboratory examination (Z01.89)  08/04/2018: Glucose 138, Creatinine 0.86, EGFR 73/84, potassium 4.9, BMP otherwise normal. TSH 2.14. Vitamin D 31.2. 01/26/2018: Creatinine 1.1, EGFR 50/60, potassium 4.4, CMP normal. CBC normal. Cholesterol 126, triglycerides 132, HDL 28, LDL 72. TSH 4.4. Labs 01/07/2018: Serum glucose 107 mg, nonfasting, BUN 12, creatinine 0.76, eGFR 84 mL, sodium 145. HB 13.6/HCT 89.8, platelets 269. Pro time normal. 01/19/2017: Cholesterol 243, triglycerides 208, HDL 49, LDL 152. Dyspnea on exertion (R06.09)  Echocardiogram 12/07/2017: Left ventricle cavity is normal in size. Mild concentric hypertrophy of the left ventricle. Normal global wall motion. Doppler evidence of grade I (impaired) diastolic dysfunction, normal LAP. Calculated EF 63%. Left atrial cavity is mildly dilated. Mild (Grade I) mitral regurgitation. Normal right atrial pressure. 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 id and distal LAD focal 50% stenosis. S/P Xience 3.5 X 28 mm DES.  Allergies Juliann Pulse [place]; 10-18-18 3:21 PM) PredniSONE *CORTICOSTEROIDS*  heart racing, rashes  Family History Laren Boom; 10-18-18 3:21 PM) Mother  Deceased. at age 47; MVA Father  Deceased. at age 57; CHF Sister 1  Deceased. older; age 52; DM1 Brother 1  older; No heart issues  Social History Laren Boom; October 18, 2018 3:21 PM) Current tobacco use  Never smoker. Alcohol Use  Occasional alcohol use. Marital status  Single. Living Situation  Lives alone. Number of Children   2.  Past Surgical  History Laren Boom; 09/20/2018 3:21 PM) Hysterectomy [2001]: Gallbladder Surgery [2004]:  Medication History Gwinda Maine, FNP-C; 09/21/2018 10:52 PM) Olmesartan Medoxomil (20MG Tablet, 1 (one) Tablet Oral daily, Taken starting 08/23/2018) Active. Pravastatin Sodium (20MG Tablet, 1 (one) Tablet Oral daily at bedtime, Taken starting 08/22/2018) Active. Isosorbide Mononitrate ER (60MG Tablet ER 24HR, 1 (one) Tablet Oral daily, Taken starting 08/22/2018) Active. (disregard hydralazine) amLODIPine Besylate (10MG Tablet, 1 (one) Tablet Tablet Tablet Oral daily, Taken starting 02/10/2018) Active. Metoprolol Tartrate (25MG Tablet, 1 (one) Tablet Tablet Tablet Oral two times daily, Taken starting 01/26/2018) Active. Nitroglycerin (0.4MG Tab Sublingual, 1 (one) Tablet Tablet Tablet T Sublingual every 5 minutes as needed for chest pain., Taken starting 11/22/2017) Active. ProAir HFA (108 (90 Base)MCG/ACT Aerosol Soln, Inhalation as needed) Active. Furosemide (40MG Tablet, 1 Oral daily) Active. DULoxetine HCl (30MG Capsule DR Part, 3 Oral daily) Active. Pramipexole Dihydrochloride (0.25MG Tablet, 1-2 Oral bedtime) Active. Clopidogrel Bisulfate (75MG Tablet, 1 Oral daily) Active. Medications Reconciled (verbally)    Review of Systems Gwinda Maine FNP-C; 09/20/2018 4:12 PM) General Present- Fatigue. Not Present- Appetite Loss and Weight Gain. Respiratory Not Present- Chronic Cough and Wakes up from Sleep Wheezing or Short of Breath. Cardiovascular Present- Chest Pain (with emotional stress or with exertional activities. Now burning sensation) and Difficulty Breathing On Exertion. Not Present- Difficulty Breathing Lying Down, Edema and Palpitations. Gastrointestinal Present- Bloating and Constipation. Not Present- Black, Tarry Stool and Difficulty Swallowing. Musculoskeletal Not Present- Decreased Range of Motion, Muscle Atrophy and Muscle Pain (resolved  with stopping Crestor). Neurological Present- Decreased Memory (mental cloudiness). Not Present- Attention Deficit. Psychiatric Not Present- Personality Changes and Suicidal Ideation. Endocrine Not Present- Cold Intolerance and Heat Intolerance. Hematology Not Present- Abnormal Bleeding. All other systems negative  Vitals Juliann Pulse [place]; 09/20/2018 3:31 PM) 09/20/2018 3:22 PM Weight: 250.44 lb Height: 67in Body Surface Area: 2.22 m Body Mass Index: 39.22 kg/m  Pulse: 61 (Regular)  P.OX: 97% (Room air) BP: 146/71 (Sitting, Left Arm, Standard)       Physical Exam Gwinda Maine FNP-C; 09/21/2018 11:00 PM) General Mental Status-Alert. General Appearance-Cooperative, Appears stated age, Not in acute distress. Build & Nutrition-Well built and Moderately obese.  Head and Neck Neck -Note: Short neck and difficult to evaluate JVD.  Thyroid Gland Characteristics - no palpable nodules, no palpable enlargement.  Chest and Lung Exam Palpation Tender - Midline Anterior Chest Wall.  Cardiovascular Cardiovascular examination reveals -normal heart sounds, regular rate and rhythm with no murmurs. Inspection Jugular vein - Right - No Distention.  Abdomen Inspection Contour - Obese and Pannus present. Palpation/Percussion Normal exam - Non Tender and No hepatosplenomegaly. Auscultation Normal exam - Bowel sounds normal.  Peripheral Vascular Lower Extremity Inspection - Bilateral - Inspection Normal. Palpation - Edema - Bilateral - No edema. Femoral pulse - Bilateral - Feeble(Pulsus difficult to feel due to patient's bodily habitus.), No Bruits. Popliteal pulse - Bilateral - Feeble(Pulsus difficult to feel due to patient's bodily habitus.). Dorsalis pedis pulse - Bilateral - Normal. Posterior tibial pulse - Bilateral - Normal. Carotid arteries - Bilateral-No Carotid bruit.  Neurologic Neurologic evaluation reveals -alert and oriented x 3 with no  impairment of recent or remote memory. Motor-Grossly intact without any focal deficits.  Musculoskeletal Global Assessment Left Lower Extremity - normal range of motion without pain. Right Lower Extremity - normal range of motion without pain.    Assessment & Plan Gwinda Maine FNP-C; 09/21/2018 11:00 PM) Atherosclerosis of native coronary artery of native heart with angina pectoris (  I25.119) Story: Coronary angiogram 01/14/2018: Prox LAD 70% FFR 0.79. id and distal LAD focal 50% stenosis. S/P Xience 3.5 X 28 mm DES. Current Plans Complete electrocardiogram (93000) Benign essential hypertension (I10) Story: EKG 09/20/2018: Normal sinus rhythm at 55 bpm, normal axis, no evidence of ischemia. Low-voltage complexes. Compared to EKG 07/25/2018 Future Plans 16/07/9603: METABOLIC PANEL, BASIC (54098) - one time Hyperlipidemia, group A (E78.00) Dyspnea on exertion (R06.09) Story: Echocardiogram 12/07/2017: Left ventricle cavity is normal in size. Mild concentric hypertrophy of the left ventricle. Normal global wall motion. Doppler evidence of grade I (impaired) diastolic dysfunction, normal LAP. Calculated EF 63%. Left atrial cavity is mildly dilated. Mild (Grade I) mitral regurgitation. Normal right atrial pressure. Future Plans 10/10/2018: Echocardiography, transthoracic, real-time with image documentation (2D), includes M-mode recording, when performed, complete, with spectral Doppler echocardiography, and with color flow Doppler echocardiography (11914) - one time Laboratory examination (Z01.89) Story: 08/04/2018: Glucose 138, Creatinine 0.86, EGFR 73/84, potassium 4.9, BMP otherwise normal. TSH 2.14. Vitamin D 31.2.  01/26/2018: Creatinine 1.1, EGFR 50/60, potassium 4.4, CMP normal. CBC normal. Cholesterol 126, triglycerides 132, HDL 28, LDL 72. TSH 4.4.  Labs 01/07/2018: Serum glucose 107 mg, nonfasting, BUN 12, creatinine 0.76, eGFR 84 mL, sodium 145. HB 13.6/HCT 89.8, platelets  269. Pro time normal.  01/19/2017: Cholesterol 243, triglycerides 208, HDL 49, LDL 152.  Note:. Recommendation:  Patient with hypertension, hyperlipidemia, asthma, and CAD s/p stent placement in April 2019.  Patient continues to have anginal symptoms despite Imdur, but has not recently had to use nitroglycerin. She has also noticed chest pain throughout the day that is unrelated to exertion the last few days. She is noted to have some mild tenderness on physical exam. She previously had normal stress testing prior to her stent placement earlier this year. I'm concerned regarding her continued symptoms. After discussion with Dr. Einar Gip, feel that her best option would be to schedule for coronary angiogram for further evaluation. Schedule for cardiac catheterization, and possible angioplasty. We discussed regarding risks, benefits, alternatives to this including stress testing, CTA and continued medical therapy. Patient wants to proceed. Understands <1-2% risk of death, stroke, MI, urgent CABG, bleeding, infection, renal failure but not limited to these. .  Blood pressure has significantly improved and will continue the same. She is now on pravastatin for hyperlipidemia and has had improvement in leg pain. Does mention mental cloudiness that could possibly be related to her statin. We will consider changing after her coronary angiogram.  Advised her to continue to use nitroglycerin as needed, but to take it easy until she can have her procedure. Advised if symptoms worsen or has unresolved chest pain with nitroglycerin use to proceed to the emergency room. She will follow up after her coronary angiogram the next few weeks.  CC: Dr. Prince Solian    Signed by Gwinda Maine, FNP-C (09/21/2018 11:00 PM)

## 2018-09-30 NOTE — Progress Notes (Signed)
09/27/2018: Glucose 104, creatinine 0.98, EGFR 62/71, potassium 4.7, BMP otherwise normal.  CBC normal.

## 2018-10-01 DIAGNOSIS — I10 Essential (primary) hypertension: Secondary | ICD-10-CM | POA: Diagnosis not present

## 2018-10-01 DIAGNOSIS — I25110 Atherosclerotic heart disease of native coronary artery with unstable angina pectoris: Secondary | ICD-10-CM | POA: Diagnosis not present

## 2018-10-01 DIAGNOSIS — R0609 Other forms of dyspnea: Secondary | ICD-10-CM | POA: Diagnosis not present

## 2018-10-01 DIAGNOSIS — Z7902 Long term (current) use of antithrombotics/antiplatelets: Secondary | ICD-10-CM | POA: Diagnosis not present

## 2018-10-01 DIAGNOSIS — Z955 Presence of coronary angioplasty implant and graft: Secondary | ICD-10-CM | POA: Diagnosis not present

## 2018-10-01 DIAGNOSIS — I208 Other forms of angina pectoris: Secondary | ICD-10-CM | POA: Diagnosis not present

## 2018-10-01 DIAGNOSIS — Z90710 Acquired absence of both cervix and uterus: Secondary | ICD-10-CM | POA: Diagnosis not present

## 2018-10-01 DIAGNOSIS — I25118 Atherosclerotic heart disease of native coronary artery with other forms of angina pectoris: Secondary | ICD-10-CM | POA: Diagnosis not present

## 2018-10-01 DIAGNOSIS — E785 Hyperlipidemia, unspecified: Secondary | ICD-10-CM | POA: Diagnosis not present

## 2018-10-01 DIAGNOSIS — I2584 Coronary atherosclerosis due to calcified coronary lesion: Secondary | ICD-10-CM | POA: Diagnosis not present

## 2018-10-01 DIAGNOSIS — Z79899 Other long term (current) drug therapy: Secondary | ICD-10-CM | POA: Diagnosis not present

## 2018-10-01 DIAGNOSIS — J45909 Unspecified asthma, uncomplicated: Secondary | ICD-10-CM | POA: Diagnosis not present

## 2018-10-01 DIAGNOSIS — Z888 Allergy status to other drugs, medicaments and biological substances status: Secondary | ICD-10-CM | POA: Diagnosis not present

## 2018-10-01 LAB — BASIC METABOLIC PANEL
Anion gap: 8 (ref 5–15)
BUN: 15 mg/dL (ref 8–23)
CO2: 24 mmol/L (ref 22–32)
Calcium: 9.1 mg/dL (ref 8.9–10.3)
Chloride: 108 mmol/L (ref 98–111)
Creatinine, Ser: 1.05 mg/dL — ABNORMAL HIGH (ref 0.44–1.00)
GFR calc non Af Amer: 57 mL/min — ABNORMAL LOW (ref >60)
Glucose, Bld: 120 mg/dL — ABNORMAL HIGH (ref 70–99)
POTASSIUM: 4.3 mmol/L (ref 3.5–5.1)
Sodium: 140 mmol/L (ref 135–145)

## 2018-10-01 LAB — CBC
HCT: 38.4 % (ref 36.0–46.0)
Hemoglobin: 13 g/dL (ref 12.0–15.0)
MCH: 30.9 pg (ref 26.0–34.0)
MCHC: 33.9 g/dL (ref 30.0–36.0)
MCV: 91.2 fL (ref 80.0–100.0)
NRBC: 0 % (ref 0.0–0.2)
Platelets: 190 10*3/uL (ref 150–400)
RBC: 4.21 MIL/uL (ref 3.87–5.11)
RDW: 12.3 % (ref 11.5–15.5)
WBC: 5.8 10*3/uL (ref 4.0–10.5)

## 2018-10-01 MED ORDER — CLOPIDOGREL BISULFATE 75 MG PO TABS
75.0000 mg | ORAL_TABLET | Freq: Once | ORAL | Status: AC
  Administered 2018-10-01: 11:00:00 75 mg via ORAL
  Filled 2018-10-01: qty 1

## 2018-10-01 MED ORDER — METOPROLOL SUCCINATE ER 25 MG PO TB24: 25.0000 mg | ORAL_TABLET | Freq: Every day | ORAL | 1 refills | Status: DC

## 2018-10-01 MED ORDER — METOPROLOL SUCCINATE ER 25 MG PO TB24: 25.0000 mg | ORAL_TABLET | Freq: Every day | ORAL | Status: DC

## 2018-10-01 NOTE — Progress Notes (Signed)
CARDIAC REHAB PHASE I   [phone] - [phone]  Pt has been walking with the nurses this morning. Still complains of chest tightness when walking. Ed complete on medications (Plavix and ASA), infection prevention, restrictions, and NTG usage. Ed complete on diet (HH) and exercise. Pt referred to [facility] at [facility]. Pt is unlikely to attend; she states that her co pays were too high in April when she originally had her stent and anticipates the same this time around. Pt voices understanding.   Philis Kendall, MS 10/01/2018 9:43 AM

## 2018-10-01 NOTE — Discharge Summary (Signed)
Physician Discharge Summary  Patient ID: Sharon Buck MRN: [id number] DOB/AGE: 62/20/1957 62 y.o.  Admit date: 09/30/2018 Discharge date: 10/01/2018  Admission Diagnoses: Exertional chest pain  Discharge Diagnoses:  Active Problems:   Angina pectoris (HCC)   Post PTCA: LAD: Prox LAD stent with mild under expansion of  prox LAD DES (placed 01/14/2018 Xience 3.5 X 28 mm DES).  S/P PTCA with 3.5 X 15 mm [place] balloon   MSA increased from 4.4 mm2 to 5.9 mm2. Mild distal LAD stenoses. Occlusion of small septal perforator branch   Essential hypertension   Hyperlipidemia   Discharged Condition: Good  Hospital Course:   62 y/o Caucasian female with hypertension, hyperlipidemia, CAD s/p pLAD PCI in 01/2018, now with worsening exertional chest pain. Given her symptoms, she is recommend to undergo coronary angiography and possible intervention.  Cath and IVUS showed mild under expansion of prox LAD stent without restenosis. Underwent dilatation with increase in MSA from 4.4 mm2 to 5.9 mm2. A small septal perforator branch was occluded. Patient had brief chest pain with resolution, without any EKG changes s/o septal infarct. Patient was kept for observation overnight on 10 mcg/min of IV NTG. Will switch to high intensity statin outpatient.   Consults:  None  Significant Diagnostic Studies:  Labs Results for Sharon, Buck (MRN [id number]) as of 10/01/2018 08:36  Ref. Range 10/01/2018 02:44  WBC Latest Ref Range: 4.0 - 10.5 K/uL 5.8  RBC Latest Ref Range: 3.87 - 5.11 MIL/uL 4.21  Hemoglobin Latest Ref Range: 12.0 - 15.0 g/dL 13.0  HCT Latest Ref Range: 36.0 - 46.0 % 38.4  MCV Latest Ref Range: 80.0 - 100.0 fL 91.2  MCH Latest Ref Range: 26.0 - 34.0 pg 30.9  MCHC Latest Ref Range: 30.0 - 36.0 g/dL 33.9  RDW Latest Ref Range: 11.5 - 15.5 % 12.3  Platelets Latest Ref Range: 150 - 400 K/uL 190  nRBC Latest Ref Range: 0.0 - 0.2 % 0.0   Results for Sharon, Buck (MRN [id number]) as of  10/01/2018 08:36  Ref. Range 10/01/2018 28:78  BASIC METABOLIC PANEL Unknown Rpt (A)  Sodium Latest Ref Range: 135 - 145 mmol/L 140  Potassium Latest Ref Range: 3.5 - 5.1 mmol/L 4.3  Chloride Latest Ref Range: 98 - 111 mmol/L 108  CO2 Latest Ref Range: 22 - 32 mmol/L 24  Glucose Latest Ref Range: 70 - 99 mg/dL 120 (H)  BUN Latest Ref Range: 8 - 23 mg/dL 15  Creatinine Latest Ref Range: 0.44 - 1.00 mg/dL 1.05 (H)  Calcium Latest Ref Range: 8.9 - 10.3 mg/dL 9.1  Anion gap Latest Ref Range: 5 - 15  8  GFR, Est Non African American Latest Ref Range: >60 mL/min 57 (L)  GFR, Est African American Latest Ref Range: >60 mL/min >60    Treatments:  Cath 09/30/2018: LM: Normal LAD: Prox LAD stent with mild under expansion of  prox LAD DES (placed 01/14/2018 Xience 3.5 X 28 mm DES).  S/P PTCA with 3.5 X 15 mm  balloon   MSA increased from 4.4 mm2 to 5.9 mm2. Mild distal LAD stenoses. Occlusion of small septal perforator branch LCx: Normal RCA: Large vessel. Ostial PDA 40% stenosis.          Sluggish flow noted in all vessels, improved with IC NTG. This Younes be due to microvascular dysfunction.  Recommendation: Patient had mild chest discomfort with ambulation with dyspnea much improved from baseline. Suspect slow flow int he coronary arteries with endothelial dysfunction and  also probably septal perforator jailing during angioplasty is the reason, but reassured the patient. Add low dose BB due to underlying bradycardia and discontinue hydralazine.Continue DAPT and current vasodilator therapy, including amlodipine.   Discharge Exam: Blood pressure (!) 103/56, pulse 62, temperature 97.9 F (36.6 C), temperature source Oral, resp. rate 15, height 5\' 7"  (1.702 m), weight 113.4 kg, SpO2 95 %. Physical Exam  Constitutional: She is oriented to person, place, and time. She appears well-developed and well-nourished.  Mildly obese  Neck: Neck supple. No JVD present.  Cardiovascular: Normal rate,  regular rhythm, normal heart sounds and intact distal pulses.  No murmur heard. Pulmonary/Chest: Effort normal and breath sounds normal.  Abdominal: Soft. Bowel sounds are normal.  Neurological: She is alert and oriented to person, place, and time.  Skin: Skin is warm and dry.  Psychiatric: She has a normal mood and affect.     Disposition:   Discharge Instructions    AMB Referral to Cardiac Rehabilitation - Phase II   Complete by:  As directed    Diagnosis:  Coronary Stents     Allergies as of 10/01/2018      Reactions   Prednisone Palpitations, Rash   Makes my heart race   Crestor [rosuvastatin Calcium] Rash   Makes her skin burn from the inside out.      Medication List    STOP taking these medications   hydrALAZINE 25 MG tablet Commonly known as:  APRESOLINE     TAKE these medications   albuterol 108 (90 Base) MCG/ACT inhaler Commonly known as:  PROVENTIL HFA;VENTOLIN HFA Inhale 2 puffs into the lungs every 6 (six) hours as needed for wheezing or shortness of breath.   amLODipine 10 MG tablet Commonly known as:  NORVASC Take 10 mg by mouth daily.   aspirin 81 MG chewable tablet Commonly known as:  ASPIRIN CHILDRENS Chew 1 tablet (81 mg total) by mouth daily.   clopidogrel 75 MG tablet Commonly known as:  PLAVIX Take 1 tablet (75 mg total) by mouth daily.   DULoxetine 30 MG capsule Commonly known as:  CYMBALTA Take 90 mg by mouth daily with lunch.   fluticasone 50 MCG/ACT nasal spray Commonly known as:  FLONASE Place 2 sprays into the nose daily as needed for allergies.   isosorbide mononitrate 60 MG 24 hr tablet Commonly known as:  IMDUR Take 60 mg by mouth daily.   metoprolol succinate 25 MG 24 hr tablet Commonly known as:  TOPROL-XL Take 1 tablet (25 mg total) by mouth daily.   nitroGLYCERIN 0.4 MG SL tablet Commonly known as:  NITROSTAT Place 0.4 mg under the tongue every 5 (five) minutes x 3 doses as needed for chest pain. Notes to patient:   Place 1 tab under tongue if needed for chest pain, if pain is unresolved after 5 minutes, place 2nd tab under tongue and call 911, if pain is unresolved after 5 minutes place 3rd tab under tongue and wait for emergency help   olmesartan 20 MG tablet Commonly known as:  BENICAR Take 20 mg by mouth daily.   pramipexole 0.25 MG tablet Commonly known as:  MIRAPEX Take 0.5 mg by mouth at bedtime.   pravastatin 20 MG tablet Commonly known as:  PRAVACHOL Take 20 mg by mouth at bedtime.        Signed: Manish J Patwardhan 10/01/2018, 1:25 AM

## 2018-10-03 ENCOUNTER — Encounter (HOSPITAL_COMMUNITY): Payer: Self-pay | Admitting: Cardiology

## 2018-10-06 ENCOUNTER — Telehealth (HOSPITAL_COMMUNITY): Payer: Self-pay

## 2018-10-06 NOTE — Telephone Encounter (Signed)
Called patient to see if she is interested in the Cardiac Rehab Program. Patient stated not at this time due to her pt responsibility after insurance. I did adv and offer the pt our CR maintenance program, pt declined.  Closed referral

## 2018-10-06 NOTE — Telephone Encounter (Signed)
Pt insurance is active and benefits verified through [place]. Co-pay $0.00, DED $750.00/$750.00 met, out of pocket $3,750.00/$3,750.00 met, co-insurance 10%. No pre-authorization required. [place]/[employer], 10/06/18 @ 3:23PM, REF# [id number]  Will contact patient to see if she is interested in the Cardiac Rehab Program. If interested, patient will need to complete follow up appt. Once completed, patient will be contacted for scheduling upon review by the RN Navigator.

## 2018-10-10 DIAGNOSIS — R0609 Other forms of dyspnea: Secondary | ICD-10-CM | POA: Diagnosis not present

## 2018-10-10 DIAGNOSIS — E7800 Pure hypercholesterolemia, unspecified: Secondary | ICD-10-CM | POA: Diagnosis not present

## 2018-10-10 DIAGNOSIS — I10 Essential (primary) hypertension: Secondary | ICD-10-CM | POA: Diagnosis not present

## 2018-10-10 DIAGNOSIS — I25119 Atherosclerotic heart disease of native coronary artery with unspecified angina pectoris: Secondary | ICD-10-CM | POA: Diagnosis not present

## 2018-10-25 ENCOUNTER — Emergency Department (HOSPITAL_COMMUNITY): Payer: BLUE CROSS/BLUE SHIELD

## 2018-10-25 ENCOUNTER — Other Ambulatory Visit: Payer: Self-pay

## 2018-10-25 ENCOUNTER — Emergency Department (HOSPITAL_COMMUNITY)
Admission: EM | Admit: 2018-10-25 | Discharge: 2018-10-25 | Disposition: A | Discharge status: Home / Self Care | Payer: BLUE CROSS/BLUE SHIELD | Attending: Emergency Medicine | Admitting: Emergency Medicine

## 2018-10-25 ENCOUNTER — Encounter (HOSPITAL_COMMUNITY): Payer: Self-pay

## 2018-10-25 DIAGNOSIS — R079 Chest pain, unspecified: Secondary | ICD-10-CM | POA: Diagnosis not present

## 2018-10-25 DIAGNOSIS — I2510 Atherosclerotic heart disease of native coronary artery without angina pectoris: Secondary | ICD-10-CM | POA: Insufficient documentation

## 2018-10-25 DIAGNOSIS — I10 Essential (primary) hypertension: Secondary | ICD-10-CM | POA: Diagnosis not present

## 2018-10-25 DIAGNOSIS — J45909 Unspecified asthma, uncomplicated: Secondary | ICD-10-CM | POA: Diagnosis not present

## 2018-10-25 DIAGNOSIS — R072 Precordial pain: Secondary | ICD-10-CM | POA: Diagnosis not present

## 2018-10-25 DIAGNOSIS — R0789 Other chest pain: Secondary | ICD-10-CM | POA: Insufficient documentation

## 2018-10-25 DIAGNOSIS — Z79899 Other long term (current) drug therapy: Secondary | ICD-10-CM | POA: Insufficient documentation

## 2018-10-25 DIAGNOSIS — R0602 Shortness of breath: Secondary | ICD-10-CM | POA: Diagnosis not present

## 2018-10-25 LAB — BASIC METABOLIC PANEL
Anion gap: 8 (ref 5–15)
BUN: 17 mg/dL (ref 8–23)
CO2: 28 mmol/L (ref 22–32)
Calcium: 11.7 mg/dL — ABNORMAL HIGH (ref 8.9–10.3)
Chloride: 102 mmol/L (ref 98–111)
Creatinine, Ser: 0.92 mg/dL (ref 0.44–1.00)
GFR calc Af Amer: >60 mL/min (ref >60)
GFR calc non Af Amer: >60 mL/min (ref >60)
Glucose, Bld: 125 mg/dL — ABNORMAL HIGH (ref 70–99)
Potassium: 4.6 mmol/L (ref 3.5–5.1)
Sodium: 138 mmol/L (ref 135–145)

## 2018-10-25 LAB — CBC
HEMATOCRIT: 39.6 % (ref 36.0–46.0)
Hemoglobin: 13.2 g/dL (ref 12.0–15.0)
MCH: 30.3 pg (ref 26.0–34.0)
MCHC: 33.3 g/dL (ref 30.0–36.0)
MCV: 91 fL (ref 80.0–100.0)
Platelets: 220 10*3/uL (ref 150–400)
RBC: 4.35 MIL/uL (ref 3.87–5.11)
RDW: 12 % (ref 11.5–15.5)
WBC: 6.7 10*3/uL (ref 4.0–10.5)
nRBC: 0 % (ref 0.0–0.2)

## 2018-10-25 LAB — I-STAT TROPONIN, ED
Troponin i, poc: 0 ng/mL (ref 0.00–0.08)
Troponin i, poc: 0 ng/mL (ref 0.00–0.08)

## 2018-10-25 NOTE — ED Provider Notes (Signed)
[facility] EMERGENCY DEPARTMENT Provider Note   CSN: [id number] Arrival date & time: 10/25/18  1731     History   Chief Complaint Chief Complaint  Patient presents with  . Chest Pain    HPI Sharon Buck is a 63 y.o. female.  Patient is a 63 year old female with history of coronary artery disease with stent in April 2019 and additional procedure to revise the stent in December.  She presents today with complaints of chest discomfort.  This occurred while she was at work.  She reports an episode of tightness in her chest and back.  She walked to a coworker's desk and began to have a sweaty episode.  Her symptoms improved, however did not completely resolve.  She then called Dr. Irven Shelling office who recommended she come to the ER to be evaluated.    The history is provided by the patient.  Chest Pain  Pain location:  Substernal area Pain quality: tightness   Pain radiates to:  Upper back Pain severity:  Moderate Onset quality:  Sudden Timing:  Constant Progression:  Improving Context: not breathing   Relieved by:  Nothing Worsened by:  Nothing Ineffective treatments:  None tried   Past Medical History:  Diagnosis Date  . Asthma   . Hypertension     Patient Active Problem List   Diagnosis Date Noted  . Essential hypertension 10/01/2018  . Hyperlipidemia 10/01/2018  . Post PTCA 09/30/2018  . Angina pectoris ([place]) 01/09/2018    Past Surgical History:  Procedure Laterality Date  . ABDOMINAL HYSTERECTOMY    . CHOLECYSTECTOMY    . CORONARY BALLOON ANGIOPLASTY N/A 09/30/2018   Procedure: CORONARY BALLOON ANGIOPLASTY;  Surgeon: Nigel Mormon, MD;  Location: [facility];  Service: Cardiovascular;  Laterality: N/A;  . CORONARY STENT INTERVENTION N/A 01/14/2018   Procedure: CORONARY STENT INTERVENTION;  Surgeon: Nigel Mormon, MD;  Location: [facility];  Service: Cardiovascular;  Laterality: N/A;  . INTRAVASCULAR PRESSURE  WIRE/FFR STUDY N/A 01/14/2018   Procedure: INTRAVASCULAR PRESSURE WIRE/FFR STUDY;  Surgeon: Nigel Mormon, MD;  Location: [facility];  Service: Cardiovascular;  Laterality: N/A;  . INTRAVASCULAR ULTRASOUND/IVUS N/A 09/30/2018   Procedure: Intravascular Ultrasound/IVUS;  Surgeon: Nigel Mormon, MD;  Location: [facility];  Service: Cardiovascular;  Laterality: N/A;  . LEFT HEART CATH AND CORONARY ANGIOGRAPHY N/A 01/14/2018   Procedure: LEFT HEART CATH AND CORONARY ANGIOGRAPHY;  Surgeon: Nigel Mormon, MD;  Location: [facility];  Service: Cardiovascular;  Laterality: N/A;  . LEFT HEART CATH AND CORONARY ANGIOGRAPHY N/A 09/30/2018   Procedure: LEFT HEART CATH AND CORONARY ANGIOGRAPHY;  Surgeon: Nigel Mormon, MD;  Location: [facility];  Service: Cardiovascular;  Laterality: N/A;  . TUBAL LIGATION       OB History   No obstetric history on file.      Home Medications    Prior to Admission medications   Medication Sig Start Date End Date Taking? Authorizing Provider  albuterol (PROVENTIL HFA;VENTOLIN HFA) 108 (90 BASE) MCG/ACT inhaler Inhale 2 puffs into the lungs every 6 (six) hours as needed for wheezing or shortness of breath.   Yes [provider]  amLODipine (NORVASC) 10 MG tablet Take 10 mg by mouth daily.   Yes [provider]  aspirin (ASPIRIN CHILDRENS) 81 MG chewable tablet Chew 1 tablet (81 mg total) by mouth daily. 01/14/18 01/14/19 Yes Patwardhan, Reynold Bowen, MD  calcium carbonate (TUMS -  DOSED IN MG ELEMENTAL CALCIUM) 500 MG chewable tablet Chew 1 tablet by mouth as needed for indigestion or heartburn.   Yes [provider]  clopidogrel (PLAVIX) 75 MG tablet Take 1 tablet (75 mg total) by mouth daily. 01/14/18 01/14/19 Yes Patwardhan, Manish J, MD  DULoxetine (CYMBALTA) 30 MG capsule Take 90 mg by mouth daily with lunch.   Yes [provider]  fluticasone (FLONASE) 50 MCG/ACT nasal spray Place 2  sprays into both nostrils daily as needed for allergies.    Yes [provider]  isosorbide mononitrate (IMDUR) 60 MG 24 hr tablet Take 180 mg by mouth daily.    Yes [provider]  metoprolol succinate (TOPROL-XL) 25 MG 24 hr tablet Take 1 tablet (25 mg total) by mouth daily. 10/01/18  Yes Adrian Prows, MD  nitroGLYCERIN (NITROSTAT) 0.4 MG SL tablet Place 0.4 mg under the tongue every 5 (five) minutes x 3 doses as needed for chest pain.   Yes [provider]  olmesartan (BENICAR) 20 MG tablet Take 20 mg by mouth daily.   Yes [provider]  pramipexole (MIRAPEX) 0.25 MG tablet Take 0.5 mg by mouth at bedtime.    Yes [provider]  pravastatin (PRAVACHOL) 20 MG tablet Take 20 mg by mouth at bedtime.   Yes [provider]    Family History History reviewed. No pertinent family history.  Social History Social History   Tobacco Use  . Smoking status: Never Smoker  . Smokeless tobacco: Never Used  Substance Use Topics  . Alcohol use: Yes  . Drug use: No     Allergies   Prednisone and Crestor [rosuvastatin calcium]   Review of Systems Review of Systems  Cardiovascular: Positive for chest pain.  All other systems reviewed and are negative.    Physical Exam Updated Vital Signs BP 111/62   Pulse (!) 50   Temp 97.6 F (36.4 C) (Oral)   Resp 12   SpO2 95%   Physical Exam Vitals signs and nursing note reviewed.  Constitutional:      General: She is not in acute distress.    Appearance: She is well-developed. She is not diaphoretic.  HENT:     Head: Normocephalic and atraumatic.  Neck:     Musculoskeletal: Normal range of motion and neck supple.  Cardiovascular:     Rate and Rhythm: Normal rate and regular rhythm.     Heart sounds: No murmur. No friction rub. No gallop.   Pulmonary:     Effort: Pulmonary effort is normal. No respiratory distress.     Breath sounds: Normal breath sounds. No wheezing.     Comments:  There is tenderness to palpation of the anterior chest wall. Abdominal:     General: Bowel sounds are normal. There is no distension.     Palpations: Abdomen is soft.     Tenderness: There is no abdominal tenderness.  Musculoskeletal: Normal range of motion.  Skin:    General: Skin is warm and dry.  Neurological:     Mental Status: She is alert and oriented to person, place, and time.      ED Treatments / Results  Labs (all labs ordered are listed, but only abnormal results are displayed) Labs Reviewed  BASIC METABOLIC PANEL - Abnormal; Notable for the following components:      Result Value   Glucose, Bld 125 (*)    Calcium 11.7 (*)    All other components within normal limits  CBC  I-STAT TROPONIN, ED  I-STAT TROPONIN, ED    EKG EKG Interpretation  Date/Time:  Tuesday October 25 2018 17:41:12 EST Ventricular Rate:  63 PR Interval:  180 QRS Duration: 84 QT Interval:  396 QTC Calculation: 405 R Axis:   8 Text Interpretation:  Normal sinus rhythm Cannot rule out Anterior infarct , age undetermined Abnormal ECG No significant change since 10/01/2018 Confirmed by Veryl Speak [phone]) on 10/25/2018 8:12:51 PM   Radiology Dg Chest 2 View  Result Date: 10/25/2018 CLINICAL DATA:  Mid chest pain, shortness of breath with exertion, symptoms since 09/30/2018 when she had a heart catheterization. Nausea and diaphoresis with pain radiating through to back, history coronary artery disease post stenting in April 2019, asthma, hypertension EXAM: CHEST - 2 VIEW COMPARISON:  07/17/2008 Correlation: CTA chest 10/12/2017 FINDINGS: Enlargement of cardiac silhouette. Mediastinal contours and pulmonary vascularity normal. Atherosclerotic calcification aorta. Lungs clear. No infiltrate, pleural effusion or pneumothorax. Bones demineralized with scattered endplate spur formation thoracic spine. IMPRESSION: Enlargement of cardiac silhouette without acute infiltrate. Electronically Signed   By:  Lavonia Dana M.D.   On: 10/25/2018 18:31    Procedures Procedures (including critical care time)  Medications Ordered in ED Medications - No data to display   Initial Impression / Assessment and Plan / ED Course  I have reviewed the triage vital signs and the nursing notes.  Pertinent labs & imaging results that were available during my care of the patient were reviewed by me and considered in my medical decision making (see chart for details).  Patient's work-up reveals a negative troponin x2 and unchanged EKG.  Her chest x-ray is clear.  Care was discussed with Dr. Einar Gip who is recommending outpatient follow-up, but also he has the patient the option of admission.  She does not want to stay in the hospital and will follow-up in the office as an outpatient.  Final Clinical Impressions(s) / ED Diagnoses   Final diagnoses:  None    ED Discharge Orders    None       Veryl Speak, MD 10/25/18 2217

## 2018-10-25 NOTE — ED Triage Notes (Signed)
Pt here with central chest pain that moves to her back for the last day.  Pt A&Ox4 with no shortness of breath. Hx of HTN and asthma.

## 2018-10-25 NOTE — Discharge Instructions (Addendum)
Continue medications as previously prescribed.  Follow-up with Dr. Einar Gip in the next few days, and return to the ER if you develop worsening pain, difficulty breathing, or other new and concerning symptoms.

## 2018-11-07 DIAGNOSIS — R0609 Other forms of dyspnea: Secondary | ICD-10-CM | POA: Diagnosis not present

## 2018-11-07 DIAGNOSIS — I10 Essential (primary) hypertension: Secondary | ICD-10-CM | POA: Diagnosis not present

## 2018-11-07 DIAGNOSIS — E7800 Pure hypercholesterolemia, unspecified: Secondary | ICD-10-CM | POA: Diagnosis not present

## 2018-11-07 DIAGNOSIS — I25119 Atherosclerotic heart disease of native coronary artery with unspecified angina pectoris: Secondary | ICD-10-CM | POA: Diagnosis not present

## 2018-11-22 ENCOUNTER — Other Ambulatory Visit: Payer: Self-pay | Admitting: Cardiology

## 2018-11-22 DIAGNOSIS — I209 Angina pectoris, unspecified: Secondary | ICD-10-CM

## 2018-11-23 ENCOUNTER — Ambulatory Visit: Payer: BLUE CROSS/BLUE SHIELD | Admitting: Cardiovascular Disease

## 2018-11-23 ENCOUNTER — Encounter: Payer: Self-pay | Admitting: Cardiovascular Disease

## 2018-11-23 ENCOUNTER — Encounter (INDEPENDENT_AMBULATORY_CARE_PROVIDER_SITE_OTHER): Payer: Self-pay

## 2018-11-23 DIAGNOSIS — I10 Essential (primary) hypertension: Secondary | ICD-10-CM

## 2018-11-23 DIAGNOSIS — E782 Mixed hyperlipidemia: Secondary | ICD-10-CM

## 2018-11-23 DIAGNOSIS — Z9861 Coronary angioplasty status: Secondary | ICD-10-CM | POA: Diagnosis not present

## 2018-11-23 MED ORDER — AMLODIPINE BESYLATE 5 MG PO TABS: 5.0000 mg | ORAL_TABLET | Freq: Every day | ORAL | 3 refills | Status: DC

## 2018-11-23 MED ORDER — HYDROCHLOROTHIAZIDE 12.5 MG PO CAPS: 12.5000 mg | ORAL_CAPSULE | Freq: Every day | ORAL | 3 refills | Status: DC

## 2018-11-23 NOTE — Assessment & Plan Note (Signed)
History of essential hypertension her blood pressure measured today at 124/76.  She is on high-dose amlodipine, Toprol and olmesartan.  She is experiencing some lower extremity edema.  I am going to cut her amlodipine in half and add hydrochlorothiazide 12.5 mg a day.  We will check a basic metabolic panel in 1 week and have him I will have her see a midlevel provider back in 2 weeks.

## 2018-11-23 NOTE — Addendum Note (Signed)
Addended by: Annita Brod on: 11/23/2018 05:02 PM   Modules accepted: Orders

## 2018-11-23 NOTE — Assessment & Plan Note (Signed)
History of hyperlipidemia on statin therapy with lipid profile performed 01/26/2018 revealing total cholesterol 126, LDL 72 and HDL of 28.

## 2018-11-23 NOTE — Progress Notes (Signed)
11/23/2018 [name]   May 28, 1956  [id number]  Primary Physician Pa, [facility] Primary Cardiologist: Lorretta Harp MD Garret Reddish, [place], [place]  HPI:  Sharon Buck is a 63 y.o. moderately overweight divorced Caucasian female mother of 2, grandmother of 4 grandchildren who works at [employer] here in our building on the third floor.  She was referred by Dr. Dagmar Hait second opinion because of recent chest pain.  She has a history of treated hypertension and hyperlipidemia.  She is not diabetic.  There is no family history for heart disease.  She is never had a heart attack or stroke.  She has had an LAD stent 01/14/2018 with a Xience 3.5 mm x 28 mm long drug-eluting stent postdilated to 3 with a 3.5 mm noncompliant balloon.  She was admitted with chest pain 09/30/2018 and underwent cath with IVUS interrogation revealing a minimal lumen area of 4.4 mm.  This was postdilated up to 5.9 mm.  There was 50% mid to distal LAD lesion and distal RCA lesion.  She has had subsequent atypical chest pain.   Current Meds  Medication Sig  . albuterol (PROVENTIL HFA;VENTOLIN HFA) 108 (90 BASE) MCG/ACT inhaler Inhale 2 puffs into the lungs every 6 (six) hours as needed for wheezing or shortness of breath.  Marland Kitchen amLODipine (NORVASC) 10 MG tablet Take 10 mg by mouth daily.  . Arginine 1000 MG TABS Take 1,000 mg by mouth daily.  Marland Kitchen aspirin (ASPIRIN CHILDRENS) 81 MG chewable tablet Chew 1 tablet (81 mg total) by mouth daily.  . calcium carbonate (TUMS - DOSED IN MG ELEMENTAL CALCIUM) 500 MG chewable tablet Chew 1 tablet by mouth as needed for indigestion or heartburn.  . clopidogrel (PLAVIX) 75 MG tablet Take 1 tablet (75 mg total) by mouth daily.  . DULoxetine (CYMBALTA) 30 MG capsule Take 90 mg by mouth daily with lunch.  . fluticasone (FLONASE) 50 MCG/ACT nasal spray Place 2 sprays into both nostrils daily as needed for allergies.   . isosorbide mononitrate (IMDUR) 60 MG 24 hr  tablet Take 180 mg by mouth daily.   . metoprolol succinate (TOPROL-XL) 25 MG 24 hr tablet Take 1 tablet (25 mg total) by mouth daily.  . nitroGLYCERIN (NITROSTAT) 0.4 MG SL tablet Place 0.4 mg under the tongue every 5 (five) minutes x 3 doses as needed for chest pain.  Marland Kitchen olmesartan (BENICAR) 20 MG tablet Take 20 mg by mouth daily.  . pramipexole (MIRAPEX) 0.25 MG tablet Take 0.5 mg by mouth at bedtime.   . pravastatin (PRAVACHOL) 20 MG tablet Take 20 mg by mouth at bedtime.     Allergies  Allergen Reactions  . Prednisone Palpitations and Rash    Makes my heart race  . Crestor [Rosuvastatin Calcium] Rash and Other (See Comments)    Makes her skin burn from the inside out.    Social History   Socioeconomic History  . Marital status: Divorced    Spouse name: Not on file  . Number of children: Not on file  . Years of education: Not on file  . Highest education level: Not on file  Occupational History  . Not on file  Social Needs  . Financial resource strain: Not on file  . Food insecurity:    Worry: Not on file    Inability: Not on file  . Transportation needs:    Medical: Not on file    Non-medical: Not on file  Tobacco Use  .  Smoking status: Never Smoker  . Smokeless tobacco: Never Used  Substance and Sexual Activity  . Alcohol use: Yes  . Drug use: No  . Sexual activity: Never  Lifestyle  . Physical activity:    Days per week: Not on file    Minutes per session: Not on file  . Stress: Not on file  Relationships  . Social connections:    Talks on phone: Not on file    Gets together: Not on file    Attends religious service: Not on file    Active member of club or organization: Not on file    Attends meetings of clubs or organizations: Not on file    Relationship status: Not on file  . Intimate partner violence:    Fear of current or ex partner: Not on file    Emotionally abused: Not on file    Physically abused: Not on file    Forced sexual activity: Not on  file  Other Topics Concern  . Not on file  Social History Narrative  . Not on file     Review of Systems: General: negative for chills, fever, night sweats or weight changes.  Cardiovascular: negative for chest pain, dyspnea on exertion, edema, orthopnea, palpitations, paroxysmal nocturnal dyspnea or shortness of breath Dermatological: negative for rash Respiratory: negative for cough or wheezing Urologic: negative for hematuria Abdominal: negative for nausea, vomiting, diarrhea, bright red blood per rectum, melena, or hematemesis Neurologic: negative for visual changes, syncope, or dizziness All other systems reviewed and are otherwise negative except as noted above.    Blood pressure 124/76, pulse 67, height 5\' 7"  (1.702 m), weight 262 lb (118.8 kg).  General appearance: alert and no distress Neck: no adenopathy, no carotid bruit, no JVD, supple, symmetrical, trachea midline and thyroid not enlarged, symmetric, no tenderness/mass/nodules Lungs: clear to auscultation bilaterally Heart: regular rate and rhythm, S1, S2 normal, no murmur, click, rub or gallop Extremities: extremities normal, atraumatic, no cyanosis or edema Pulses: 2+ and symmetric Skin: Skin color, texture, turgor normal. No rashes or lesions Neurologic: Alert and oriented X 3, normal strength and tone. Normal symmetric reflexes. Normal coordination and gait  EKG not performed today  ASSESSMENT AND PLAN:   Hyperlipidemia History of hyperlipidemia on statin therapy with lipid profile performed 01/26/2018 revealing total cholesterol 126, LDL 72 and HDL of 28.  Essential hypertension History of essential hypertension her blood pressure measured today at 124/76.  She is on high-dose amlodipine, Toprol and olmesartan.  She is experiencing some lower extremity edema.  I am going to cut her amlodipine in half and add hydrochlorothiazide 12.5 mg a day.  We will check a basic metabolic panel in 1 week and have him I will  have her see a midlevel provider back in 2 weeks.  Post PTCA History of CAD status post LAD stenting by Dr. Einar Gip 01/14/2018.  Because of recurrent chest pain she was admitted 09/30/2018 and underwent cardiac catheterization with IVUS interrogation of the LAD stent.  The minimal lumen area was 4.4 mm.  This was dilated with a larger balloon up to 5.9 mm.  She had a 50% mid to distal LAD lesion and distal RCA lesion.  She has had some recurrent chest pain but I am not convinced this was anginal.  She remains on dual antiplatelet therapy including aspirin and Plavix.      Lorretta Harp MD FACP,FACC,FAHA, [facility] 11/23/2018 4:40 PM

## 2018-11-23 NOTE — Patient Instructions (Signed)
Medication Instructions:  DECREASE YOUR AMLODIPINE TO 5 MG BY MOUTH DAILY  START HYDROCHLOROTHIAZIDE 12.5 MG DAILY If you need a refill on your cardiac medications before your next appointment, please call your pharmacy.   Lab work: Your physician recommends that you return for lab work in: 1 WEEK: BMP  If you have labs (blood work) drawn today and your tests are completely normal, you will receive your results only by: Marland Kitchen MyChart Message (if you have MyChart) OR . A paper copy in the mail If you have any lab test that is abnormal or we need to change your treatment, we will call you to review the results.  Testing/Procedures: NONE  Follow-Up: At [facility], you and your health needs are our priority.  As part of our continuing mission to provide you with exceptional heart care, we have created designated Provider Care Teams.  These Care Teams include your primary Cardiologist (physician) and Advanced Practice Providers (APPs -  Physician Assistants and Nurse Practitioners) who all work together to provide you with the care you need, when you need it. . You will need a follow up appointment in 2 weeks with an APP and in 6 months with Dr. Gwenlyn Found.  You [place] see Dr. Gwenlyn Found or one of the following Advanced Practice Providers on your designated Care Team:   . Kerin Ransom, [place] . Almyra Deforest, PA-C . Fabian Sharp, PA-C . Jory Sims, DNP . Rosaria Ferries, PA-C . Roby Lofts, PA-C . Sande Rives, PA-C

## 2018-11-23 NOTE — Assessment & Plan Note (Signed)
History of CAD status post LAD stenting by Dr. Einar Gip 01/14/2018.  Because of recurrent chest pain she was admitted 09/30/2018 and underwent cardiac catheterization with IVUS interrogation of the LAD stent.  The minimal lumen area was 4.4 mm.  This was dilated with a larger balloon up to 5.9 mm.  She had a 50% mid to distal LAD lesion and distal RCA lesion.  She has had some recurrent chest pain but I am not convinced this was anginal.  She remains on dual antiplatelet therapy including aspirin and Plavix.

## 2018-11-30 DIAGNOSIS — I10 Essential (primary) hypertension: Secondary | ICD-10-CM | POA: Diagnosis not present

## 2018-12-01 LAB — BASIC METABOLIC PANEL WITH GFR
BUN/Creatinine Ratio: 18 (ref 12–28)
BUN: 18 mg/dL (ref 8–27)
CO2: 23 mmol/L (ref 20–29)
Calcium: 9.8 mg/dL (ref 8.7–10.3)
Chloride: 96 mmol/L (ref 96–106)
Creatinine, Ser: 1 mg/dL (ref 0.57–1.00)
GFR calc Af Amer: 69 mL/min/1.73
GFR calc non Af Amer: 60 mL/min/1.73
Glucose: 115 mg/dL — ABNORMAL HIGH (ref 65–99)
Potassium: 4.9 mmol/L (ref 3.5–5.2)
Sodium: 137 mmol/L (ref 134–144)

## 2018-12-02 ENCOUNTER — Telehealth: Payer: Self-pay

## 2018-12-02 NOTE — Telephone Encounter (Signed)
Pt aware of results. Pt aware of f/u appt on 2/26 with PA and recall in 6 mos from last OV with Sharon Buck

## 2018-12-07 ENCOUNTER — Ambulatory Visit: Payer: BLUE CROSS/BLUE SHIELD | Admitting: Cardiology

## 2018-12-07 VITALS — BP 120/70 | HR 57 | Ht 67.0 in | Wt 258.6 lb

## 2018-12-07 DIAGNOSIS — I10 Essential (primary) hypertension: Secondary | ICD-10-CM

## 2018-12-07 DIAGNOSIS — E785 Hyperlipidemia, unspecified: Secondary | ICD-10-CM

## 2018-12-07 DIAGNOSIS — I2510 Atherosclerotic heart disease of native coronary artery without angina pectoris: Secondary | ICD-10-CM | POA: Diagnosis not present

## 2018-12-07 DIAGNOSIS — Z9861 Coronary angioplasty status: Secondary | ICD-10-CM

## 2018-12-07 NOTE — Assessment & Plan Note (Signed)
BMI 40- she could not finish a home sleep study in the past but thinks she sleeps well.

## 2018-12-07 NOTE — Progress Notes (Signed)
12/07/2018 Sharon Buck   11/11/1955  [id number]  Primary Physician Pa, [facility] Primary Cardiologist: Dr Sharon Buck  HPI: Patient is a 63 year old female who was referred to Dr. Alvester Buck by the patient's PCP Dr. Elsworth Buck at [facility]  The patient had a calf and LAD intervention with a DES in April 2019 by Dr. Einar Buck.  She had a cath again in December 2019.  She underwent p.o. BA for in-stent restenosis.  She saw Dr. Alvester Buck November 23, 2018.  She had complained of some lower extremity edema and he cut her amlodipine to 5 mg a day and added HCTZ 12.5 mg a day.  Follow-up BM P look good, her creatinine was 1.0.  She is in the office today for follow-up.  Her edema was much better on the 5 mg.  She has no other complaints.  She has a follow-up scheduled for her routine annual physical with her PCP in April.   Current Outpatient Medications  Medication Sig Dispense Refill  . albuterol (PROVENTIL HFA;VENTOLIN HFA) 108 (90 BASE) MCG/ACT inhaler Inhale 2 puffs into the lungs every 6 (six) hours as needed for wheezing or shortness of breath.    Marland Kitchen amLODipine (NORVASC) 5 MG tablet Take 1 tablet (5 mg total) by mouth daily. 90 tablet 3  . Arginine 1000 MG TABS Take 1,000 mg by mouth daily.    Marland Kitchen aspirin (ASPIRIN CHILDRENS) 81 MG chewable tablet Chew 1 tablet (81 mg total) by mouth daily. 90 tablet 3  . calcium carbonate (TUMS - DOSED IN MG ELEMENTAL CALCIUM) 500 MG chewable tablet Chew 1 tablet by mouth as needed for indigestion or heartburn.    . clopidogrel (PLAVIX) 75 MG tablet Take 1 tablet (75 mg total) by mouth daily. 90 tablet 3  . DULoxetine (CYMBALTA) 30 MG capsule Take 90 mg by mouth daily with lunch.    . fluticasone (FLONASE) 50 MCG/ACT nasal spray Place 2 sprays into both nostrils daily as needed for allergies.     . hydrochlorothiazide (MICROZIDE) 12.5 MG capsule Take 1 capsule (12.5 mg total) by mouth daily. 90 capsule 3  . isosorbide mononitrate (IMDUR) 60 MG 24  hr tablet Take 180 mg by mouth daily.     . metoprolol succinate (TOPROL-XL) 25 MG 24 hr tablet Take 1 tablet (25 mg total) by mouth daily. 30 tablet 1  . nitroGLYCERIN (NITROSTAT) 0.4 MG SL tablet Place 0.4 mg under the tongue every 5 (five) minutes x 3 doses as needed for chest pain.    Marland Kitchen olmesartan (BENICAR) 20 MG tablet Take 20 mg by mouth daily.    . pramipexole (MIRAPEX) 0.25 MG tablet Take 0.5 mg by mouth at bedtime.     . pravastatin (PRAVACHOL) 20 MG tablet Take 20 mg by mouth at bedtime.     No current facility-administered medications for this visit.     Allergies  Allergen Reactions  . Prednisone Palpitations and Rash    Makes my heart race  . Crestor [Rosuvastatin Calcium] Rash and Other (See Comments)    Makes her skin burn from the inside out.    Past Medical History:  Diagnosis Date  . Asthma   . Hypertension     Social History   Socioeconomic History  . Marital status: Divorced    Spouse name: Not on file  . Number of children: Not on file  . Years of education: Not on file  . Highest education level: Not on file  Occupational History  .  Not on file  Social Needs  . Financial resource strain: Not on file  . Food insecurity:    Worry: Not on file    Inability: Not on file  . Transportation needs:    Medical: Not on file    Non-medical: Not on file  Tobacco Use  . Smoking status: Never Smoker  . Smokeless tobacco: Never Used  Substance and Sexual Activity  . Alcohol use: Yes  . Drug use: No  . Sexual activity: Never  Lifestyle  . Physical activity:    Days per week: Not on file    Minutes per session: Not on file  . Stress: Not on file  Relationships  . Social connections:    Talks on phone: Not on file    Gets together: Not on file    Attends religious service: Not on file    Active member of club or organization: Not on file    Attends meetings of clubs or organizations: Not on file    Relationship status: Not on file  . Intimate partner  violence:    Fear of current or ex partner: Not on file    Emotionally abused: Not on file    Physically abused: Not on file    Forced sexual activity: Not on file  Other Topics Concern  . Not on file  Social History Narrative  . Not on file     Family History  Problem Relation Age of Onset  . Asthma Mother   . Heart disease Father   . Hypertension Father   . Diabetes Father      Review of Systems: General: negative for chills, fever, night sweats or weight changes.  Cardiovascular: negative for chest pain, dyspnea on exertion, edema, orthopnea, palpitations, paroxysmal nocturnal dyspnea or shortness of breath Dermatological: negative for rash Respiratory: negative for cough or wheezing Urologic: negative for hematuria Abdominal: negative for nausea, vomiting, diarrhea, bright red blood per rectum, melena, or hematemesis Neurologic: negative for visual changes, syncope, or dizziness All other systems reviewed and are otherwise negative except as noted above.    Blood pressure 120/70, pulse (!) 57, height 5\' 7"  (1.702 m), weight 258 lb 9.6 oz (117.3 kg).  General appearance: alert, cooperative, no distress and morbidly obese Lungs: clear to auscultation bilaterally Heart: regular rate and rhythm Extremities: trace-if any- LE edema Skin: warm and dry Neurologic: Grossly normal   ASSESSMENT AND PLAN:   Essential hypertension Edema of her LE on Norvasc 10 mg- better on 5 mg  CAD -S/P PCI LAD PCI with DES April 2019 (Dr Sharon Buck) Re look 10/01/2019 with IVUS- POBA for ISR  Dyslipidemia, goal LDL below 70 Intolerant to Crestor- followed by PCP  Morbid obesity ([name]) BMI 40- she could not finish a home sleep study in the past but thinks she sleeps well.   PLAN  Follow up Dr Sharon Buck in 6 months.   Sharon Ransom PA-C 12/07/2018 3:26 PM

## 2018-12-07 NOTE — Assessment & Plan Note (Signed)
Intolerant to Crestor- followed by PCP

## 2018-12-07 NOTE — Assessment & Plan Note (Signed)
Edema of her LE on Norvasc 10 mg- better on 5 mg

## 2018-12-07 NOTE — Patient Instructions (Signed)
Medication Instructions:  Your physician recommends that you continue on your current medications as directed. Please refer to the Current Medication list given to you today. If you need a refill on your cardiac medications before your next appointment, please call your pharmacy.   Lab work: NONE  If you have labs (blood work) drawn today and your tests are completely normal, you will receive your results only by: Marland Kitchen MyChart Message (if you have MyChart) OR . A paper copy in the mail If you have any lab test that is abnormal or we need to change your treatment, we will call you to review the results.  Testing/Procedures: NONE   Follow-Up: At [facility], you and your health needs are our priority.  As part of our continuing mission to provide you with exceptional heart care, we have created designated Provider Care Teams.  These Care Teams include your primary Cardiologist (physician) and Advanced Practice Providers (APPs -  Physician Assistants and Nurse Practitioners) who all work together to provide you with the care you need, when you need it. You will need a follow up appointment in 6 months.  Please call our office 2 months in advance to schedule this appointment.  You [place] see Quay Burow or one of the following Advanced Practice Providers on your designated Care Team:   Kerin Ransom, PA-C Roby Lofts, [place] . Sande Rives, PA-C  Any Other Special Instructions Will Be Listed Below (If Applicable).

## 2018-12-07 NOTE — Assessment & Plan Note (Signed)
LAD PCI with DES April 2019 (Dr Einar Gip) Re look 10/01/2019 with IVUS- POBA for ISR

## 2018-12-20 ENCOUNTER — Ambulatory Visit: Payer: Self-pay | Admitting: Cardiology

## 2019-01-08 ENCOUNTER — Other Ambulatory Visit: Payer: Self-pay | Admitting: Cardiology

## 2019-01-31 DIAGNOSIS — Z0000 Encounter for general adult medical examination without abnormal findings: Secondary | ICD-10-CM | POA: Diagnosis not present

## 2019-01-31 DIAGNOSIS — E038 Other specified hypothyroidism: Secondary | ICD-10-CM | POA: Diagnosis not present

## 2019-01-31 DIAGNOSIS — E1151 Type 2 diabetes mellitus with diabetic peripheral angiopathy without gangrene: Secondary | ICD-10-CM | POA: Diagnosis not present

## 2019-02-03 ENCOUNTER — Other Ambulatory Visit: Payer: Self-pay | Admitting: Cardiovascular Disease

## 2019-02-03 MED ORDER — ISOSORBIDE MONONITRATE ER 60 MG PO TB24: 180.0000 mg | ORAL_TABLET | Freq: Every day | ORAL | 5 refills | Status: DC

## 2019-02-03 MED ORDER — PRAVASTATIN SODIUM 20 MG PO TABS: 20.0000 mg | ORAL_TABLET | Freq: Every day | ORAL | 5 refills | Status: DC

## 2019-02-03 MED ORDER — NITROGLYCERIN 0.4 MG SL SUBL: 0.4000 mg | SUBLINGUAL_TABLET | SUBLINGUAL | 2 refills | Status: AC | PRN

## 2019-02-03 MED ORDER — METOPROLOL SUCCINATE ER 25 MG PO TB24: 25.0000 mg | ORAL_TABLET | Freq: Every day | ORAL | 1 refills | Status: DC

## 2019-02-03 MED ORDER — CLOPIDOGREL BISULFATE 75 MG PO TABS: 75.0000 mg | ORAL_TABLET | Freq: Every day | ORAL | 5 refills | Status: DC

## 2019-02-03 NOTE — Telephone Encounter (Signed)
Pt calling requesting a refill on pravastatin, metoprolol, isosorbide and clopidogrel. Sent to [facility] on [address]. Pt would like a call back if needed any more information. [phone]. Please address

## 2019-02-03 NOTE — Telephone Encounter (Signed)
Refilled pravastatin, NTG. Plavix. Metoprolol and isosorbide.

## 2019-02-07 DIAGNOSIS — I10 Essential (primary) hypertension: Secondary | ICD-10-CM | POA: Diagnosis not present

## 2019-02-07 DIAGNOSIS — Z1331 Encounter for screening for depression: Secondary | ICD-10-CM | POA: Diagnosis not present

## 2019-02-07 DIAGNOSIS — G4733 Obstructive sleep apnea (adult) (pediatric): Secondary | ICD-10-CM | POA: Diagnosis not present

## 2019-02-07 DIAGNOSIS — Z0000 Encounter for general adult medical examination without abnormal findings: Secondary | ICD-10-CM | POA: Diagnosis not present

## 2019-02-07 DIAGNOSIS — E1151 Type 2 diabetes mellitus with diabetic peripheral angiopathy without gangrene: Secondary | ICD-10-CM | POA: Diagnosis not present

## 2019-02-07 DIAGNOSIS — R82998 Other abnormal findings in urine: Secondary | ICD-10-CM | POA: Diagnosis not present

## 2019-02-07 DIAGNOSIS — I25118 Atherosclerotic heart disease of native coronary artery with other forms of angina pectoris: Secondary | ICD-10-CM | POA: Diagnosis not present

## 2019-02-08 NOTE — Telephone Encounter (Signed)
Sharon Buck I tried to get patient to schedule she want's to speak with you first .

## 2019-02-08 NOTE — Telephone Encounter (Signed)
Received another update from Dr. Danna Hefty office, he recommends pt start a cpap/discuss further with Dr. Rexene Alberts.  Due to current COVID 19 pandemic, our office is severely reducing in office visits for at least the next 2 weeks, in order to minimize the risk to our patients and healthcare providers.   I called pt to offer her a VV. No answer, left a message asking her to call me back. If pt calls back, please offer her a VV with Dr. Rexene Alberts to discuss her sleep.

## 2019-02-08 NOTE — Telephone Encounter (Signed)
I called pt again to discuss. No answer, left a message asking her to call me back. If pt calls back, please schedule her for an appt next week with Dr. Rexene Alberts.

## 2019-02-08 NOTE — Telephone Encounter (Signed)
Patient called she will do Doxy. Me. I have verified everything in [place] Patient wants you to call her back .   [phone]  Patient has android  , laptop and desk top .  [employer] .

## 2019-02-09 NOTE — Telephone Encounter (Signed)
I called pt again to discuss. No answer, left a message asking her to call me back. 

## 2019-02-09 NOTE — Telephone Encounter (Signed)
I got the pt scheduled for a virtual visit with Dr. Rexene Alberts pt is stating she does not want a cpap machine. She stated she is not able to wear anything that will be strapped to her head. Also, she does not feel that the hst was accurate due to her not being able to sleep.  Due to current COVID 19 pandemic, our office is severely reducing in office visits, in order to minimize the risk to our patients and healthcare providers.    Pt understands that although there [place] be some limitations with this type of visit, we will take all precautions to reduce any security or privacy concerns.  Pt understands that this will be treated like an in office visit and we will file with pt's insurance, and there [place] be a patient responsible charge related to this service.  Pt's email is [email or web address]. Pt understands that the nurse will be calling to go over pt's chart.

## 2019-02-15 ENCOUNTER — Telehealth: Payer: Self-pay | Admitting: Neurology

## 2019-02-15 NOTE — Telephone Encounter (Signed)
I called pt. Pt's meds, allergies, and PMH were updated.  Her PCP wants her sleep to be discussed again with Dr. Rexene Alberts. Pt is reluctant to start a cpap.

## 2019-02-15 NOTE — Telephone Encounter (Signed)
I called pt to discuss updating her chart prior to her visit tomorrow with Dr. Rexene Alberts. No answer, left a message asking her to call me back.

## 2019-02-15 NOTE — Telephone Encounter (Signed)
Patient returning your call from today She will be in a meeting from 2:30 until about 3:15. Please call her back at [phone]

## 2019-02-16 ENCOUNTER — Ambulatory Visit (INDEPENDENT_AMBULATORY_CARE_PROVIDER_SITE_OTHER): Payer: BLUE CROSS/BLUE SHIELD | Admitting: Neurology

## 2019-02-16 ENCOUNTER — Encounter: Payer: Self-pay | Admitting: Neurology

## 2019-02-16 ENCOUNTER — Other Ambulatory Visit: Payer: Self-pay

## 2019-02-16 DIAGNOSIS — G4733 Obstructive sleep apnea (adult) (pediatric): Secondary | ICD-10-CM

## 2019-02-16 NOTE — Patient Instructions (Signed)
Given verbally, during today's virtual video-based encounter, with verbal feedback received.   

## 2019-02-16 NOTE — Progress Notes (Signed)
Order for new auto pap sent to [facility] via community message. Confirmation received that the order transmitted was successful.

## 2019-02-16 NOTE — Progress Notes (Signed)
Interim history:  Sharon Buck is a 63 year old right-handed woman with an underlying medical history of asthma, coronary artery disease with status post stent placement in April 2019, restless leg syndrome, hypertension and obesity, who presents for a virtual, video-based visit via [email or web address] for follow-up consultation of her obstructive sleep apnea after home sleep testing last year. The patient is unaccompanied today and joins via laptop from her home, I am located in my office. I first met her on 03/22/2018 at the request of her cardiology nurse practitioner, at which time the patient reported snoring and daytime somnolence. Her insurance denied laboratory attended sleep study. She had a home sleep test on 05/24/2018 which indicated moderate obstructive sleep apnea with an AHI of 18.2 per hour, O2 nadir of 83%. She declined CPAP therapy at the time.  She had an interim coronary angioplasty in December 2019.  Today, 02/16/2019: Please also see below for virtual visit documentation.   Previously (copied from previous notes for reference):   03/22/2018: (She) reports snoring and excessive daytime somnolence. I reviewed your office note from 01/21/2018, which you kindly included. Her Epworth sleepiness score is 9 out of 24, fatigue score is 36/63. She is single and lives alone. She has 2 children. She is a nonsmoker and drinks alcohol about once a week on average, drinks caffeine about once daily in the form of diet soda. Her weight has been fluctuating a little bit but generally is stable. Her son lives in [place], her daughter lives locally. Patient raised her granddaughter until she was 27, her granddaughter is getting ready to move back in, age 62. Patient reports a family history of snoring and congestive heart failure on her fathers side. She has no nighttime had nocturia but has had morning headaches in the past. She has asthma since childhood, generally under good control with as needed use of  Ventolin. Bedtime is around 11 and rise time around 7. She has 2 dogs and one cat in the household. She does not watch TV in bed. She has a stressful job, sometimes works 11 hours. She does endorse daytime tiredness and somnolence at times.  Her Past Medical History Is Significant For: Past Medical History:  Diagnosis Date   Asthma    Hypertension     Her Past Surgical History Is Significant For: Past Surgical History:  Procedure Laterality Date   ABDOMINAL HYSTERECTOMY     CHOLECYSTECTOMY     CORONARY BALLOON ANGIOPLASTY N/A 09/30/2018   Procedure: CORONARY BALLOON ANGIOPLASTY;  Surgeon: Nigel Mormon, MD;  Location: [facility];  Service: Cardiovascular;  Laterality: N/A;   CORONARY STENT INTERVENTION N/A 01/14/2018   Procedure: CORONARY STENT INTERVENTION;  Surgeon: Nigel Mormon, MD;  Location: [place] [facility];  Service: Cardiovascular;  Laterality: N/A;   INTRAVASCULAR PRESSURE WIRE/FFR STUDY N/A 01/14/2018   Procedure: INTRAVASCULAR PRESSURE WIRE/FFR STUDY;  Surgeon: Nigel Mormon, MD;  Location: [facility] [facility];  Service: Cardiovascular;  Laterality: N/A;   INTRAVASCULAR ULTRASOUND/IVUS N/A 09/30/2018   Procedure: Intravascular Ultrasound/IVUS;  Surgeon: Nigel Mormon, MD;  Location: [facility];  Service: Cardiovascular;  Laterality: N/A;   LEFT HEART CATH AND CORONARY ANGIOGRAPHY N/A 01/14/2018   Procedure: LEFT HEART CATH AND CORONARY ANGIOGRAPHY;  Surgeon: Nigel Mormon, MD;  Location: [facility];  Service: Cardiovascular;  Laterality: N/A;   LEFT HEART CATH AND CORONARY ANGIOGRAPHY N/A 09/30/2018   Procedure: LEFT HEART CATH AND CORONARY ANGIOGRAPHY;  Surgeon: Vernell Leep  J, MD;  Location: [facility];  Service: Cardiovascular;  Laterality: N/A;   TUBAL LIGATION      Her Family History Is Significant For: Family History  Problem Relation Age of Onset   Asthma Mother    Heart disease Father     Hypertension Father    Diabetes Father     Her Social History Is Significant For: Social History   Socioeconomic History   Marital status: Divorced    Spouse name: Not on file   Number of children: Not on file   Years of education: Not on file   Highest education level: Not on file  Occupational History   Not on file  Social Needs   Financial resource strain: Not on file   Food insecurity:    Worry: Not on file    Inability: Not on file   Transportation needs:    Medical: Not on file    Non-medical: Not on file  Tobacco Use   Smoking status: Never Smoker   Smokeless tobacco: Never Used  Substance and Sexual Activity   Alcohol use: Yes   Drug use: No   Sexual activity: Never  Lifestyle   Physical activity:    Days per week: Not on file    Minutes per session: Not on file   Stress: Not on file  Relationships   Social connections:    Talks on phone: Not on file    Gets together: Not on file    Attends religious service: Not on file    Active member of club or organization: Not on file    Attends meetings of clubs or organizations: Not on file    Relationship status: Not on file  Other Topics Concern   Not on file  Social History Narrative   Not on file    Her Allergies Are:  Allergies  Allergen Reactions   Prednisone Palpitations and Rash    Makes my heart race   Crestor [Rosuvastatin Calcium] Rash and Other (See Comments)    Makes her skin burn from the inside out.  :   Her Current Medications Are:  Outpatient Encounter Medications as of 02/16/2019  Medication Sig   albuterol (PROVENTIL HFA;VENTOLIN HFA) 108 (90 BASE) MCG/ACT inhaler Inhale 2 puffs into the lungs every 6 (six) hours as needed for wheezing or shortness of breath.   amLODipine (NORVASC) 5 MG tablet Take 1 tablet (5 mg total) by mouth daily.   Arginine 1000 MG TABS Take 1,000 mg by mouth daily.   aspirin EC 81 MG tablet Take 81 mg by mouth daily.   calcium  carbonate (TUMS - DOSED IN MG ELEMENTAL CALCIUM) 500 MG chewable tablet Chew 1 tablet by mouth as needed for indigestion or heartburn.   clopidogrel (PLAVIX) 75 MG tablet Take 1 tablet (75 mg total) by mouth daily.   DULoxetine (CYMBALTA) 30 MG capsule Take 90 mg by mouth daily with lunch.   fluticasone (FLONASE) 50 MCG/ACT nasal spray Place 2 sprays into both nostrils daily as needed for allergies.    hydrochlorothiazide (MICROZIDE) 12.5 MG capsule Take 1 capsule (12.5 mg total) by mouth daily.   isosorbide mononitrate (IMDUR) 60 MG 24 hr tablet Take 3 tablets (180 mg total) by mouth daily.   loratadine (CLARITIN) 10 MG tablet Take 10 mg by mouth daily as needed for allergies.   metoprolol succinate (TOPROL-XL) 25 MG 24 hr tablet Take 1 tablet (25 mg total) by mouth daily.   nitroGLYCERIN (NITROSTAT) 0.4  MG SL tablet Place 1 tablet (0.4 mg total) under the tongue every 5 (five) minutes x 3 doses as needed for chest pain.   olmesartan (BENICAR) 20 MG tablet Take 20 mg by mouth daily.   pramipexole (MIRAPEX) 0.25 MG tablet Take 0.5 mg by mouth at bedtime.    pravastatin (PRAVACHOL) 20 MG tablet Take 1 tablet (20 mg total) by mouth at bedtime.   No facility-administered encounter medications on file as of 02/16/2019.   :  Review of Systems:  Out of a complete 14 point review of systems, all are reviewed and negative with the exception of these symptoms as listed below:  Virtual Visit via Video Note on 02/16/2019:  I connected with Sharon Buck on 02/16/19 at  1:30 PM EDT by a video enabled telemedicine application and verified that I am speaking with the correct person using two identifiers.   I discussed the limitations of evaluation and management by telemedicine and the availability of in person appointments. The patient expressed understanding and agreed to proceed.  History of Present Illness:  She reports that she established care with a new cardiologist because he is closer to her  workplace.  She saw Dr. Alvester Chou in February 2020 and I reviewed his note.  She is reluctant to consider CPAP therapy because she is worried she cannot tolerate something on her face, had a hard time with a home sleep test equipment even.  She is also worried about the additional cost of using a machine.  She is also worried about the portability of a PAP machine because she travels I reviewed her recent telehealth visit with her primary care physician from4/28/2020.  Dr. Dagmar Hait recommends that she be treated for her sleep apnea    Observations/Objective: The most recent vital signs available for my review in her chart are from 12/07/2018: Blood pressure 120/70 with a pulse of 57, weight 258.6 pounds for a BMI of 40.5. On examination, she is in no acute distress.She is pleasant and conversant.She is partially edentulous.She is not wearing her partial plates.Face is symmetric with normal facial animation.Extraocular movements are well preserved without obvious nystagmus, she wears corrective eyeglasses. Airway examination reveals Fairly unchanged findings from before.  Upper body movements are grossly intact, muscle bulk appears normal. Upper extremity coordination well-preserved.  Assessment and Plan: In summary, Sharon Buck is a very pleasant 63 year old female with an underlying medical history of asthma, coronary artery disease with status post stent placement in April 2019, Status post recent PTCA, restless leg syndrome, hypertension and obesity, who Presents for reevaluation of her moderate obstructive sleep apnea as determined by home sleep testing On 05/24/2018. She is still reluctant to pursue AutoPap therapy but would be willing to look into it. We talked about alternative treatment options. She is advised to continue to pursue weight lossShe is advised that dental treatment option [place] be limited in her case. Surgical treatment options are never first-line.  She is encouraged to try AutoPap therapy at  this point. She is advised that we will send the order to a DME company.  She does not currently have a preference but is encouraged to look into her insurance plan as far as in network limitations. She will need to follow-up in clinic in about 3 months,She is advised to call our office should she have any questions or concerns. I answered all her questions today and she was in agreement.  Follow Up Instructions: Start autoPAP.  FU in 3 mo, [place] see  NP.    I discussed the assessment and treatment plan with the patient. The patient was provided an opportunity to ask questions and all were answered. The patient agreed with the plan and demonstrated an understanding of the instructions.   The patient was advised to call back or seek an in-person evaluation if the symptoms worsen or if the condition fails to improve as anticipated.  I provided 30 minutes of non-face-to-face time during this encounter.   Star Age, MD

## 2019-02-22 DIAGNOSIS — G4733 Obstructive sleep apnea (adult) (pediatric): Secondary | ICD-10-CM | POA: Diagnosis not present

## 2019-03-10 ENCOUNTER — Telehealth: Payer: Self-pay

## 2019-03-10 NOTE — Telephone Encounter (Signed)
   [facility] Pre-operative Risk Assessment    Request for surgical clearance:  1. What type of surgery is being performed? Teeth extractions  2. When is this surgery scheduled? [place]  3. What type of clearance is required (medical clearance vs. Pharmacy clearance to hold med vs. Both)? Pharmacy  4. Are there any medications that need to be held prior to surgery and how long?  Plavix and Aspirin  5. Practice name and name of physician performing surgery? [facility]  6. What is your office phone number [phone]   7.   What is your office fax number [phone]  8.   Anesthesia type  not listed   Kathyrn Lass 03/10/2019, 6:20 PM  _________________________________________________________________   (provider comments below)

## 2019-03-13 NOTE — Telephone Encounter (Signed)
Just °

## 2019-03-13 NOTE — Telephone Encounter (Signed)
   Primary Cardiologist: Quay Burow, MD  Chart reviewed as part of pre-operative protocol coverage. Patient was contacted 03/13/2019 in reference to pre-operative risk assessment for pending surgery as outlined below.  Sharon Buck was last seen on 12/07/18 by Kerin Ransom PA-C. She has hx of CAD s/p DES to LAD 01/2018, and POBA to LAD for ISR in 09/2018. Also has hx of HTN, asthma, morbid obesity, dyslipidemia, OSA.  If simple extractions would not need to hold DAPT. Callback staff, please call dental office and clarify how many extractions and what kind of anesthesia is being used.  Charlie Pitter, PA-C 03/13/2019, 11:12 AM

## 2019-03-13 NOTE — Telephone Encounter (Signed)
I would not recommend holding DAPT for dental extraction

## 2019-03-13 NOTE — Telephone Encounter (Signed)
   Primary Cardiologist: Quay Burow, MD  Chart revisited as part of pre-operative protocol coverage. I spoke with patient who affirms she is doing well. Given past medical history and time since last visit, based on ACC/AHA guidelines, Sharon Buck would be at acceptable risk for the planned procedure without further cardiovascular testing.    However, Dr. Gwenlyn Found states, "I would not recommend holding DAPT for dental extraction." The patient had PCI in 04/2018 and then required repeat coronary intervention in 09/2018 (less than 12 months ago). Would suggest to dental team to explore other ways to accomplish extraction without needing to hold blood thinners. Most simple extractions of 1-3 teeth can be accomplished without holidng blood thinners. Will defer to dental team to review. Pt has appt tomorrow with them to discuss.  I will route to callback staff to assist with calling dental office in AM to make sure they received fax since pt has appt there tomorrow. The fax number entered by our office has way too many numbers. I called the office number listed and they indicated their fax # is [phone] .Will try routing this message to that #.  Charlie Pitter, PA-C 03/13/2019, 4:55 PM

## 2019-03-13 NOTE — Telephone Encounter (Signed)
Since patient is having 9 teeth extracted, will forward to Dr. Gwenlyn Found for input on holding ASA + Plavix. Had DES to LAD 01/2018 with POBA in 09/2018. Then pt will need preop phone call.  Dr. Gwenlyn Found, - Please route response to P [facility] DIV PREOP (the pre-op pool). Thank you.

## 2019-03-13 NOTE — Telephone Encounter (Signed)
Call placed to [facility] re: surgical clearance request below.   Leda Gauze and she has clarified That pt is only scheduled for 9 extractions using local anesthesia.

## 2019-03-14 NOTE — Telephone Encounter (Signed)
Call placed to [facility] [facility] with Thurmond Butts and he has confirmed that they have received the surgical clearance back with the recommendations of pt NOT coming off of her DAPT.  He confirmed the Dentist was aware and it has been saved into the pt's chart.

## 2019-03-25 DIAGNOSIS — G4733 Obstructive sleep apnea (adult) (pediatric): Secondary | ICD-10-CM | POA: Diagnosis not present

## 2019-04-24 DIAGNOSIS — G4733 Obstructive sleep apnea (adult) (pediatric): Secondary | ICD-10-CM | POA: Diagnosis not present

## 2019-05-03 DIAGNOSIS — E7849 Other hyperlipidemia: Secondary | ICD-10-CM | POA: Diagnosis not present

## 2019-05-03 DIAGNOSIS — I10 Essential (primary) hypertension: Secondary | ICD-10-CM | POA: Diagnosis not present

## 2019-05-03 DIAGNOSIS — E1151 Type 2 diabetes mellitus with diabetic peripheral angiopathy without gangrene: Secondary | ICD-10-CM | POA: Diagnosis not present

## 2019-05-17 DIAGNOSIS — E1151 Type 2 diabetes mellitus with diabetic peripheral angiopathy without gangrene: Secondary | ICD-10-CM | POA: Diagnosis not present

## 2019-05-17 DIAGNOSIS — G4733 Obstructive sleep apnea (adult) (pediatric): Secondary | ICD-10-CM | POA: Diagnosis not present

## 2019-05-17 DIAGNOSIS — I10 Essential (primary) hypertension: Secondary | ICD-10-CM | POA: Diagnosis not present

## 2019-05-17 DIAGNOSIS — I25118 Atherosclerotic heart disease of native coronary artery with other forms of angina pectoris: Secondary | ICD-10-CM | POA: Diagnosis not present

## 2019-05-22 ENCOUNTER — Other Ambulatory Visit: Payer: Self-pay | Admitting: Cardiovascular Disease

## 2019-05-25 ENCOUNTER — Other Ambulatory Visit: Payer: Self-pay | Admitting: *Deleted

## 2019-05-25 DIAGNOSIS — G4733 Obstructive sleep apnea (adult) (pediatric): Secondary | ICD-10-CM | POA: Diagnosis not present

## 2019-05-25 MED ORDER — METOPROLOL SUCCINATE ER 25 MG PO TB24: 25.0000 mg | ORAL_TABLET | Freq: Every day | ORAL | 3 refills | Status: AC

## 2019-06-27 ENCOUNTER — Other Ambulatory Visit: Payer: Self-pay

## 2019-06-27 ENCOUNTER — Ambulatory Visit: Payer: BC Managed Care – PPO | Admitting: Cardiovascular Disease

## 2019-06-27 ENCOUNTER — Encounter: Payer: Self-pay | Admitting: Cardiovascular Disease

## 2019-06-27 DIAGNOSIS — I2510 Atherosclerotic heart disease of native coronary artery without angina pectoris: Secondary | ICD-10-CM | POA: Diagnosis not present

## 2019-06-27 DIAGNOSIS — E785 Hyperlipidemia, unspecified: Secondary | ICD-10-CM

## 2019-06-27 DIAGNOSIS — I10 Essential (primary) hypertension: Secondary | ICD-10-CM | POA: Diagnosis not present

## 2019-06-27 DIAGNOSIS — Z9861 Coronary angioplasty status: Secondary | ICD-10-CM

## 2019-06-27 DIAGNOSIS — R600 Localized edema: Secondary | ICD-10-CM

## 2019-06-27 MED ORDER — HYDROCHLOROTHIAZIDE 12.5 MG PO CAPS: 25.0000 mg | ORAL_CAPSULE | Freq: Every day | ORAL | 3 refills | Status: AC

## 2019-06-27 NOTE — Assessment & Plan Note (Signed)
History of morbid obesity with a BMI of 40 having gained 8 pounds in the last 6 months.  Talked about the importance of weight reduction

## 2019-06-27 NOTE — Assessment & Plan Note (Signed)
History of hyperlipidemia on statin therapy with lipid profile performed 05/03/2019 revealing total cholesterol 132, LDL 60 and HDL of 38

## 2019-06-27 NOTE — Assessment & Plan Note (Signed)
History of CAD status post LAD stenting April 2019 by Dr. Einar Gip with 3 intervention by Dr. Virgina Jock in December for in-stent restenosis.  She is on dual antiplatelet therapy and denies chest pain or shortness of breath.

## 2019-06-27 NOTE — Assessment & Plan Note (Signed)
Bilateral lower extremity edema which improved after reduction in her amlodipine dose.  She is on hydrochlorothiazide 12.5 mg a day and avoid salt.  I am going to increase her hydrochlorothiazide to 25 mg a day and we will check a basic metabolic panel in 2 weeks

## 2019-06-27 NOTE — Progress Notes (Signed)
06/27/2019 Sharon Buck   [birth year]  [id number]  Primary Physician Pa, [facility] Primary Cardiologist: Lorretta Harp MD Garret Reddish, [place], [place]  HPI:  Sharon Buck is a 63 y.o.  moderately overweight divorced Caucasian female mother of 2, grandmother of 4 grandchildren who works at [employer] here in our building on the third floor.  She was referred by Dr. Dagmar Hait second opinion because of recent chest pain.  I last saw her in the office 12/07/2018. She has a history of treated hypertension and hyperlipidemia.  She is not diabetic.  There is no family history for heart disease.  She is never had a heart attack or stroke.  She has had an LAD stent 01/14/2018 with a Xience 3.5 mm x 28 mm long drug-eluting stent postdilated to 3 with a 3.5 mm noncompliant balloon.  She was admitted with chest pain 09/30/2018 and underwent cath with IVUS interrogation revealing a minimal lumen area of 4.4 mm.  This was postdilated up to 5.9 mm.  There was 50% mid to distal LAD lesion and distal RCA lesion.  She has had subsequent atypical chest pain.  Since I saw her 6 months ago she is done well.  She denies chest pain or shortness of breath.  She just does check her blood pressure at home and those have been under good control.    Current Meds  Medication Sig  . albuterol (PROVENTIL HFA;VENTOLIN HFA) 108 (90 BASE) MCG/ACT inhaler Inhale 2 puffs into the lungs every 6 (six) hours as needed for wheezing or shortness of breath.  Marland Kitchen amLODipine (NORVASC) 5 MG tablet Take 1 tablet (5 mg total) by mouth daily.  . Arginine 1000 MG TABS Take 1,000 mg by mouth daily.  Marland Kitchen aspirin EC 81 MG tablet Take 81 mg by mouth daily.  . calcium carbonate (TUMS - DOSED IN MG ELEMENTAL CALCIUM) 500 MG chewable tablet Chew 1 tablet by mouth as needed for indigestion or heartburn.  . clopidogrel (PLAVIX) 75 MG tablet Take 1 tablet (75 mg total) by mouth daily.  . DULoxetine (CYMBALTA) 30 MG capsule  Take 90 mg by mouth daily with lunch.  . fluticasone (FLONASE) 50 MCG/ACT nasal spray Place 2 sprays into both nostrils daily as needed for allergies.   . isosorbide mononitrate (IMDUR) 60 MG 24 hr tablet Take 3 tablets (180 mg total) by mouth daily.  Marland Kitchen loratadine (CLARITIN) 10 MG tablet Take 10 mg by mouth daily as needed for allergies.  . metoprolol succinate (TOPROL-XL) 25 MG 24 hr tablet Take 1 tablet (25 mg total) by mouth daily.  . nitroGLYCERIN (NITROSTAT) 0.4 MG SL tablet Place 1 tablet (0.4 mg total) under the tongue every 5 (five) minutes x 3 doses as needed for chest pain.  Marland Kitchen olmesartan (BENICAR) 20 MG tablet Take 20 mg by mouth daily.  . pramipexole (MIRAPEX) 0.25 MG tablet Take 0.5 mg by mouth at bedtime.   . pravastatin (PRAVACHOL) 20 MG tablet Take 1 tablet (20 mg total) by mouth at bedtime.     Allergies  Allergen Reactions  . Prednisone Palpitations and Rash    Makes my heart race  . Crestor [Rosuvastatin Calcium] Rash and Other (See Comments)    Makes her skin burn from the inside out.    Social History   Socioeconomic History  . Marital status: Divorced    Spouse name: Not on file  . Number of children: Not on file  . Years of  education: Not on file  . Highest education level: Not on file  Occupational History  . Not on file  Social Needs  . Financial resource strain: Not on file  . Food insecurity    Worry: Not on file    Inability: Not on file  . Transportation needs    Medical: Not on file    Non-medical: Not on file  Tobacco Use  . Smoking status: Never Smoker  . Smokeless tobacco: Never Used  Substance and Sexual Activity  . Alcohol use: Yes  . Drug use: No  . Sexual activity: Never  Lifestyle  . Physical activity    Days per week: Not on file    Minutes per session: Not on file  . Stress: Not on file  Relationships  . Social Herbalist on phone: Not on file    Gets together: Not on file    Attends religious service: Not on file     Active member of club or organization: Not on file    Attends meetings of clubs or organizations: Not on file    Relationship status: Not on file  . Intimate partner violence    Fear of current or ex partner: Not on file    Emotionally abused: Not on file    Physically abused: Not on file    Forced sexual activity: Not on file  Other Topics Concern  . Not on file  Social History Narrative  . Not on file     Review of Systems: General: negative for chills, fever, night sweats or weight changes.  Cardiovascular: negative for chest pain, dyspnea on exertion, edema, orthopnea, palpitations, paroxysmal nocturnal dyspnea or shortness of breath Dermatological: negative for rash Respiratory: negative for cough or wheezing Urologic: negative for hematuria Abdominal: negative for nausea, vomiting, diarrhea, bright red blood per rectum, melena, or hematemesis Neurologic: negative for visual changes, syncope, or dizziness All other systems reviewed and are otherwise negative except as noted above.    Blood pressure (!) 144/90, pulse 74, temperature 98.2 F (36.8 C), height 5\' 8"  (1.727 m), weight 266 lb (120.7 kg).  General appearance: alert and no distress Neck: no adenopathy, no carotid bruit, no JVD, supple, symmetrical, trachea midline and thyroid not enlarged, symmetric, no tenderness/mass/nodules Lungs: clear to auscultation bilaterally Heart: regular rate and rhythm, S1, S2 normal, no murmur, click, rub or gallop Extremities: extremities normal, atraumatic, no cyanosis or edema Pulses: 2+ and symmetric Skin: Skin color, texture, turgor normal. No rashes or lesions Neurologic: Alert and oriented X 3, normal strength and tone. Normal symmetric reflexes. Normal coordination and gait  EKG normal sinus rhythm at 74 without ST or T wave changes.  I personally reviewed this EKG  ASSESSMENT AND PLAN:   CAD -S/P PCI History of CAD status post LAD stenting April 2019 by Dr. Einar Gip with 3  intervention by Dr. Virgina Jock in December for in-stent restenosis.  She is on dual antiplatelet therapy and denies chest pain or shortness of breath.  Essential hypertension History of essential hypertension blood pressure measured today 144/90 although her home blood pressures which are much better than this.  She is on amlodipine, metoprolol, Benicar and hydrochlorothiazide.  Dyslipidemia, goal LDL below 70 History of hyperlipidemia on statin therapy with lipid profile performed 05/03/2019 revealing total cholesterol 132, LDL 60 and HDL of 38  Morbid obesity (HCC) History of morbid obesity with a BMI of 40 having gained 8 pounds in the last 6 months.  Talked about  the importance of weight reduction  Bilateral lower extremity edema Bilateral lower extremity edema which improved after reduction in her amlodipine dose.  She is on hydrochlorothiazide 12.5 mg a day and avoid salt.  I am going to increase her hydrochlorothiazide to 25 mg a day and we will check a basic metabolic panel in 2 weeks      Lorretta Harp MD [facility], [facility] 06/27/2019 4:48 PM

## 2019-06-27 NOTE — Patient Instructions (Addendum)
Medication Instructions:  Your physician has recommended you make the following change in your medication:  INCREASE YOUR HYDROCHLOROTHIAZIDE TO 25 MG BY MOUTH DAILY  If you need a refill on your cardiac medications before your next appointment, please call your pharmacy.   Lab work: Your physician recommends that you return for lab work in: 2 WEEKS ([place])  If you have labs (blood work) drawn today and your tests are completely normal, you will receive your results only by: Marland Kitchen MyChart Message (if you have MyChart) OR . A paper copy in the mail If you have any lab test that is abnormal or we need to change your treatment, we will call you to review the results.  Testing/Procedures: NONE  Follow-Up: At [facility], you and your health needs are our priority.  As part of our continuing mission to provide you with exceptional heart care, we have created designated Provider Care Teams.  These Care Teams include your primary Cardiologist (physician) and Advanced Practice Providers (APPs -  Physician Assistants and Nurse Practitioners) who all work together to provide you with the care you need, when you need it. . You will need a follow up appointment in 2 months with Kerin Ransom, PA-C and in 12 months with Dr. Quay Burow.  Please call our office 2 months in advance to schedule each appointment.

## 2019-06-27 NOTE — Assessment & Plan Note (Signed)
History of essential hypertension blood pressure measured today 144/90 although her home blood pressures which are much better than this.  She is on amlodipine, metoprolol, Benicar and hydrochlorothiazide.

## 2019-08-07 DIAGNOSIS — Z1231 Encounter for screening mammogram for malignant neoplasm of breast: Secondary | ICD-10-CM | POA: Diagnosis not present

## 2019-08-09 DIAGNOSIS — H02834 Dermatochalasis of left upper eyelid: Secondary | ICD-10-CM | POA: Diagnosis not present

## 2019-08-09 DIAGNOSIS — H02832 Dermatochalasis of right lower eyelid: Secondary | ICD-10-CM | POA: Diagnosis not present

## 2019-08-09 DIAGNOSIS — H02831 Dermatochalasis of right upper eyelid: Secondary | ICD-10-CM | POA: Diagnosis not present

## 2019-08-09 DIAGNOSIS — H02413 Mechanical ptosis of bilateral eyelids: Secondary | ICD-10-CM | POA: Diagnosis not present

## 2019-08-15 DIAGNOSIS — H53483 Generalized contraction of visual field, bilateral: Secondary | ICD-10-CM | POA: Diagnosis not present

## 2019-09-13 DIAGNOSIS — I10 Essential (primary) hypertension: Secondary | ICD-10-CM | POA: Diagnosis not present

## 2019-09-13 DIAGNOSIS — I25118 Atherosclerotic heart disease of native coronary artery with other forms of angina pectoris: Secondary | ICD-10-CM | POA: Diagnosis not present

## 2019-09-13 DIAGNOSIS — G4733 Obstructive sleep apnea (adult) (pediatric): Secondary | ICD-10-CM | POA: Diagnosis not present

## 2019-09-13 DIAGNOSIS — E1151 Type 2 diabetes mellitus with diabetic peripheral angiopathy without gangrene: Secondary | ICD-10-CM | POA: Diagnosis not present

## 2019-09-18 DIAGNOSIS — G4733 Obstructive sleep apnea (adult) (pediatric): Secondary | ICD-10-CM | POA: Diagnosis not present

## 2019-11-29 ENCOUNTER — Other Ambulatory Visit: Payer: Self-pay | Admitting: Cardiovascular Disease

## 2019-12-24 IMAGING — CT CT ANGIO CHEST
1 of 5 series · 12 of 30 positions shown · IV contrast (iopamidol)
Comparison: PA and lateral chest 07/17/2008.

CLINICAL DATA: Cough, chest pain and dizziness for 2 weeks.

EXAM:
CT ANGIOGRAPHY CHEST WITH CONTRAST
TECHNIQUE: Multidetector CT imaging of the chest was performed using the
standard protocol during bolus administration of intravenous
contrast. Multiplanar CT image reconstructions and MIPs were
obtained to evaluate the vascular anatomy.
CONTRAST:  75 ml 2AEXHX-ALV IOPAMIDOL (2AEXHX-ALV) INJECTION 76%

[Series 6: (id) d 1.5 b31s · axial · 0.69mm/px · z∈[-234,-21]mm · 12 of 253 slices shown]
[im 20/253  lung]
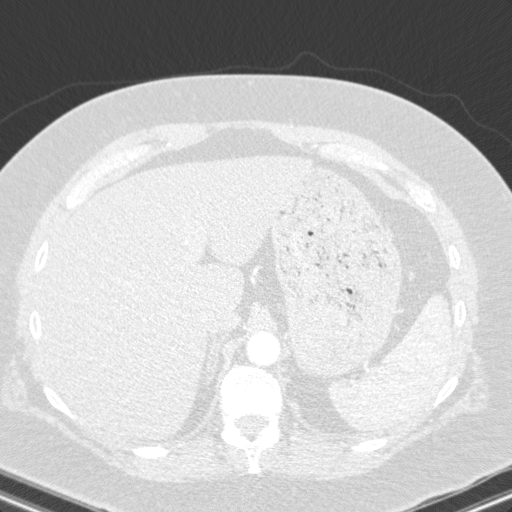
[im 39/253  mediastinal]
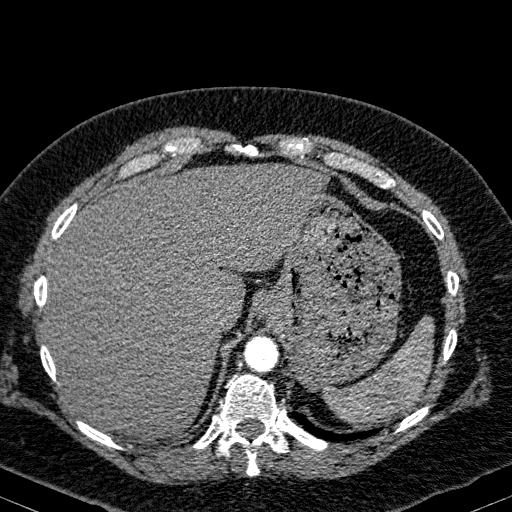
[im 59/253  lung]
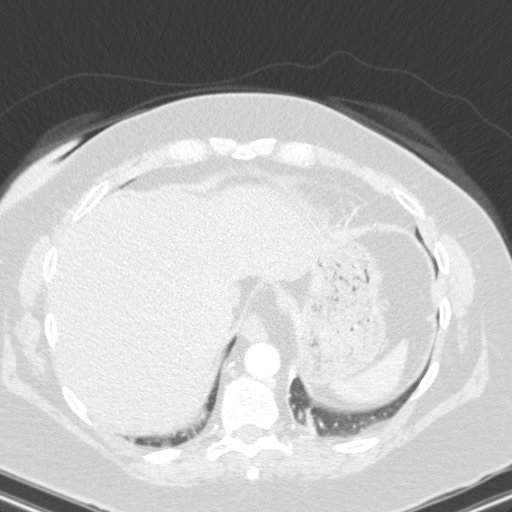
[im 78/253  mediastinal]
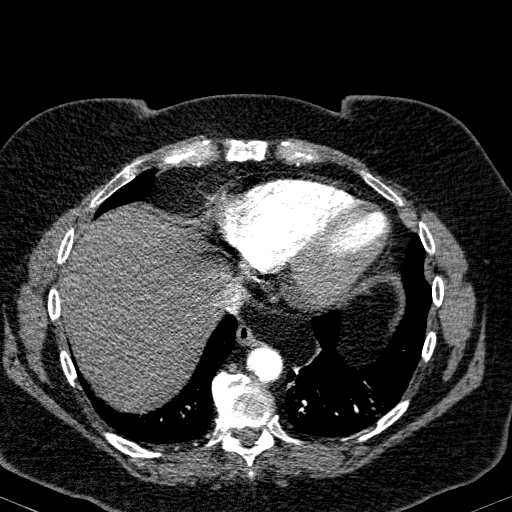
[im 97/253  lung]
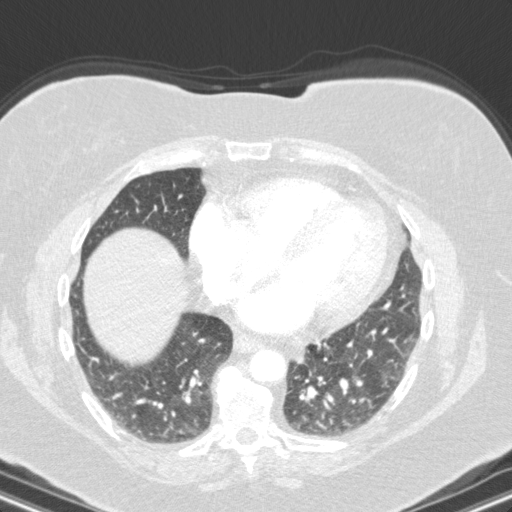
[im 117/253  mediastinal]
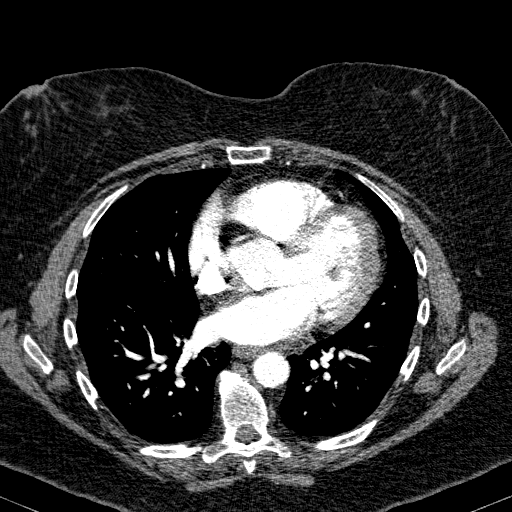
[im 136/253  lung]
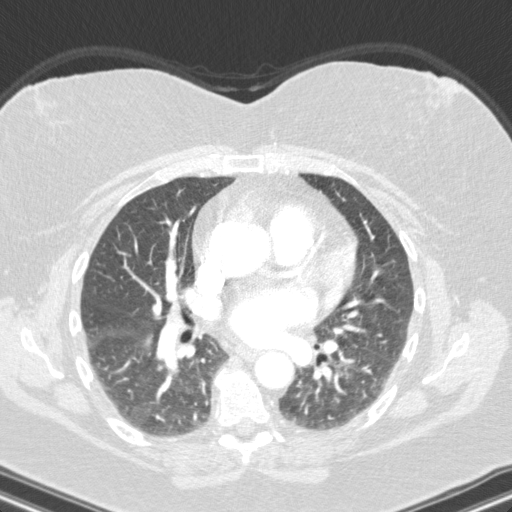
[im 156/253  mediastinal]
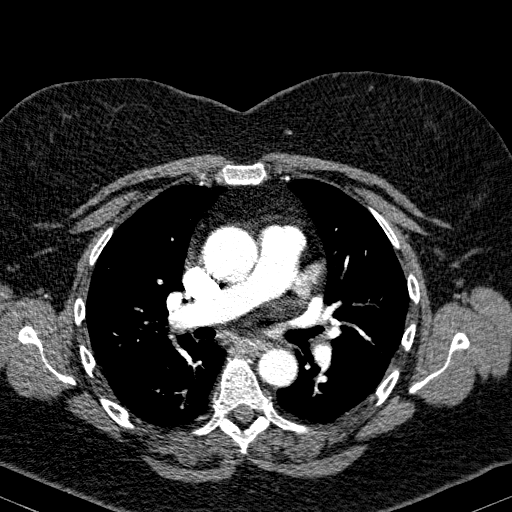
[im 175/253  lung]
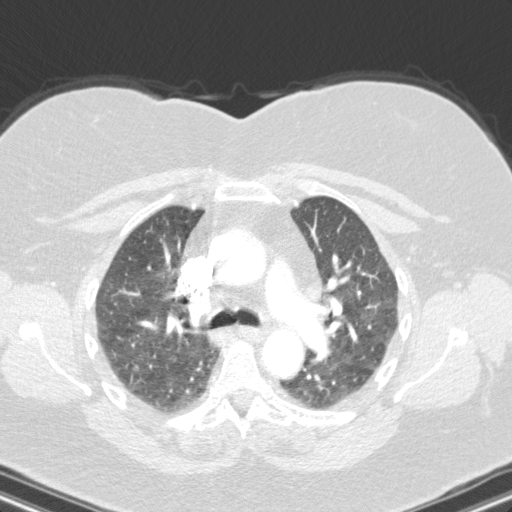
[im 194/253  mediastinal]
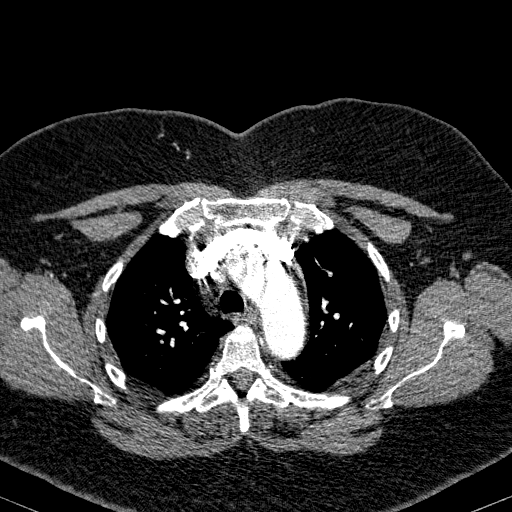
[im 214/253  lung]
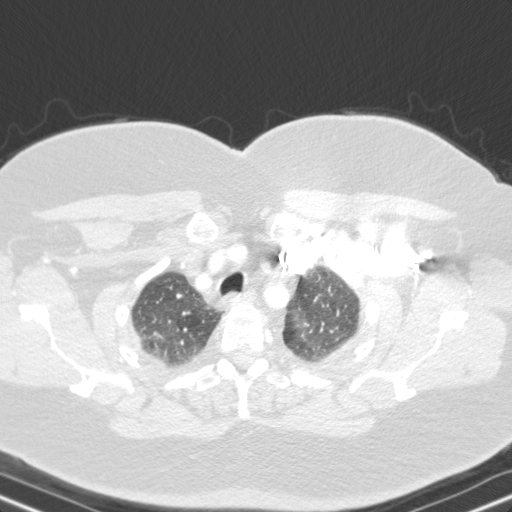
[im 233/253  mediastinal]
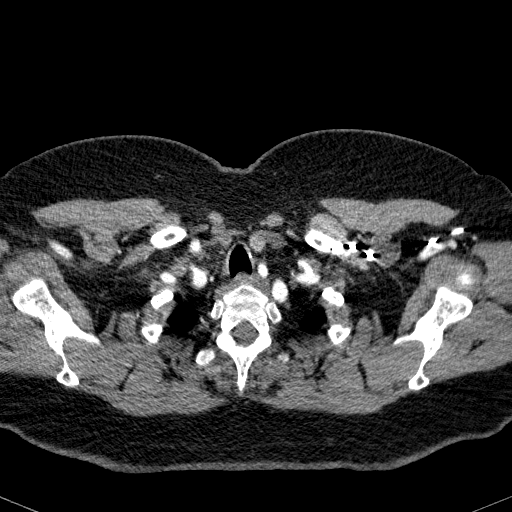

[12 of 30 positions shown; findings below may reference images not displayed]

FINDINGS: Cardiovascular: No pulmonary embolus is identified. There are some
calcific aortic and coronary atherosclerotic calcifications. Heart
size is upper normal. No pericardial effusion. No aneurysm. No
evidence of dissection.

Mediastinum/Nodes: No enlarged mediastinal, hilar, or axillary lymph
nodes. Thyroid gland, trachea, and esophagus demonstrate no
significant findings.

Lungs/Pleura: No pleural effusion. Mild dependent atelectasis. Lungs
otherwise clear. Airways unremarkable.

Upper Abdomen: Fatty infiltration of the liver is seen. Otherwise
negative.

Musculoskeletal: No lytic or sclerotic lesion is identified.
Scattered anterior endplate spurring mid and lower thoracic spine
noted.

Review of the MIP images confirms the above findings.
IMPRESSION: Negative for pulmonary embolus or acute disease.

Scattered calcific coronary atherosclerotic calcifications.

Fatty infiltration of the liver.

Aortic Atherosclerosis (E04NT-DVJ.J).

## 2020-02-08 DIAGNOSIS — Z0000 Encounter for general adult medical examination without abnormal findings: Secondary | ICD-10-CM | POA: Diagnosis not present

## 2020-02-08 DIAGNOSIS — E038 Other specified hypothyroidism: Secondary | ICD-10-CM | POA: Diagnosis not present

## 2020-02-08 DIAGNOSIS — E7849 Other hyperlipidemia: Secondary | ICD-10-CM | POA: Diagnosis not present

## 2020-02-08 DIAGNOSIS — E1151 Type 2 diabetes mellitus with diabetic peripheral angiopathy without gangrene: Secondary | ICD-10-CM | POA: Diagnosis not present

## 2020-02-13 ENCOUNTER — Encounter: Payer: Self-pay | Admitting: Cardiovascular Disease

## 2020-02-13 DIAGNOSIS — I25118 Atherosclerotic heart disease of native coronary artery with other forms of angina pectoris: Secondary | ICD-10-CM | POA: Diagnosis not present

## 2020-02-13 DIAGNOSIS — E1151 Type 2 diabetes mellitus with diabetic peripheral angiopathy without gangrene: Secondary | ICD-10-CM | POA: Diagnosis not present

## 2020-02-13 DIAGNOSIS — I10 Essential (primary) hypertension: Secondary | ICD-10-CM | POA: Diagnosis not present

## 2020-02-13 DIAGNOSIS — R82998 Other abnormal findings in urine: Secondary | ICD-10-CM | POA: Diagnosis not present

## 2020-02-13 DIAGNOSIS — G4733 Obstructive sleep apnea (adult) (pediatric): Secondary | ICD-10-CM | POA: Diagnosis not present

## 2020-02-13 DIAGNOSIS — Z0000 Encounter for general adult medical examination without abnormal findings: Secondary | ICD-10-CM | POA: Diagnosis not present

## 2020-02-13 DIAGNOSIS — Z1331 Encounter for screening for depression: Secondary | ICD-10-CM | POA: Diagnosis not present

## 2020-05-14 ENCOUNTER — Ambulatory Visit (INDEPENDENT_AMBULATORY_CARE_PROVIDER_SITE_OTHER): Payer: BC Managed Care – PPO | Admitting: Pharmacist Clinician (PhC)/ Clinical Pharmacy Specialist

## 2020-05-14 ENCOUNTER — Other Ambulatory Visit: Payer: Self-pay

## 2020-05-14 VITALS — BP 122/84

## 2020-05-14 DIAGNOSIS — E785 Hyperlipidemia, unspecified: Secondary | ICD-10-CM | POA: Diagnosis not present

## 2020-05-14 DIAGNOSIS — I10 Essential (primary) hypertension: Secondary | ICD-10-CM | POA: Diagnosis not present

## 2020-05-14 MED ORDER — PRAVASTATIN SODIUM 20 MG PO TABS: 20.0000 mg | ORAL_TABLET | Freq: Every evening | ORAL | 3 refills | Status: AC

## 2020-05-14 NOTE — Assessment & Plan Note (Addendum)
Patient continues to have LEE despite cutting back on amlodipine from 10 mg to 5 mg daily.  BP currently well controlled, notes that home BP readings are mostly 056-979 systolic.  I am going to have her stop the amlodipine for now and see if issue improves in the next 2-3 weeks.  Will consider increasing olmesartan from 20 to 40 mg daily if amlodipine is cause of edema and blood pressure increases.  Also Bartek need to switch from hydrochlorothiazide back to furosemide if edema continues.

## 2020-05-14 NOTE — Progress Notes (Deleted)
Patient ID: Sharon Buck                 DOB: 1955-12-17                    MRN: [id number]     HPI: Sharon Buck is a 64 y.o. female patient referred to lipid clinic by Dr Gwenlyn Found. PMH is significant for hypertension, hyperlipidemia, CAD s/p PCI, angina pectoris  Current Medications:  Pravastatin 20mg   Intolerances:  rosuvastatin 20mg - rash and burning sensation Atorvastatin 80mg  daily  LDL goal: 70mg /dL  Diet:   Exercise:   Family History:   Social History:   Labs: 02/08/2020: CHO 200, TG 170, HDL 37, LDL 129 (pravastatrin 20mg ??)  Past Medical History:  Diagnosis Date  . Asthma   . Hypertension     Current Outpatient Medications on File Prior to Visit  Medication Sig Dispense Refill  . albuterol (PROVENTIL HFA;VENTOLIN HFA) 108 (90 BASE) MCG/ACT inhaler Inhale 2 puffs into the lungs every 6 (six) hours as needed for wheezing or shortness of breath.    Marland Kitchen amLODipine (NORVASC) 5 MG tablet Take 1 tablet by mouth daily 90 tablet 2  . Arginine 1000 MG TABS Take 1,000 mg by mouth daily.    Marland Kitchen aspirin EC 81 MG tablet Take 81 mg by mouth daily.    . calcium carbonate (TUMS - DOSED IN MG ELEMENTAL CALCIUM) 500 MG chewable tablet Chew 1 tablet by mouth as needed for indigestion or heartburn.    . clopidogrel (PLAVIX) 75 MG tablet Take 1 tablet (75 mg total) by mouth daily. 30 tablet 5  . DULoxetine (CYMBALTA) 30 MG capsule Take 90 mg by mouth daily with lunch.    . fluticasone (FLONASE) 50 MCG/ACT nasal spray Place 2 sprays into both nostrils daily as needed for allergies.     . hydrochlorothiazide (MICROZIDE) 12.5 MG capsule Take 2 capsules (25 mg total) by mouth daily. 180 capsule 3  . isosorbide mononitrate (IMDUR) 60 MG 24 hr tablet Take 3 tablets (180 mg total) by mouth daily. 90 tablet 5  . loratadine (CLARITIN) 10 MG tablet Take 10 mg by mouth daily as needed for allergies.    . metoprolol succinate (TOPROL-XL) 25 MG 24 hr tablet Take 1 tablet (25 mg total) by mouth daily.  90 tablet 3  . nitroGLYCERIN (NITROSTAT) 0.4 MG SL tablet Place 1 tablet (0.4 mg total) under the tongue every 5 (five) minutes x 3 doses as needed for chest pain. 25 tablet 2  . olmesartan (BENICAR) 20 MG tablet Take 20 mg by mouth daily.    . pramipexole (MIRAPEX) 0.25 MG tablet Take 0.5 mg by mouth at bedtime.     . pravastatin (PRAVACHOL) 20 MG tablet Take 1 tablet (20 mg total) by mouth at bedtime. 30 tablet 5   No current facility-administered medications on file prior to visit.    Allergies  Allergen Reactions  . Prednisone Palpitations and Rash    Makes my heart race  . Crestor [Rosuvastatin Calcium] Rash and Other (See Comments)    Makes her skin burn from the inside out.    No problem-specific Assessment & Plan notes found for this encounter.      Frady Taddeo Rodriguez-Guzman PharmD, BCPS, [facility] [address] [place] [address] 05/14/2020 9:57 AM

## 2020-05-14 NOTE — Patient Instructions (Addendum)
Your Results:             Your most recent labs Goal  Total Cholesterol 200 < 200  Triglycerides 170 < 150  HDL (happy/good cholesterol) 37 > 40  LDL (lousy/bad cholesterol 129 < 70   Medication changes:  If you do well with the sample of Repatha, call the office and we will start the prior authorization process.  You will need to activate the copay card before taking it to the pharmacy.   Stop the amlodipine for now.  Keep an eye on your blood pressure once daily, as it will probably start to increase in the next 4-5 days.  Let us know how high it goes when you call back.  Also monitor the swelling in your ankles/feet and let us know if it is the same/worse/better  Lab orders:  We will repeat cholesterol labs in about 3 months, after 5-6 doses.    PThank you for choosing [facility]   Deago Burruss/Raquel at [phone]

## 2020-05-14 NOTE — Assessment & Plan Note (Signed)
Patient with ASCVD and LDL > 70 currently only able to tolerate pravastatin 20 mg daily.  She was given a sample of Repatha 140 mg in the office today to try.  She is to call back in a week to let us know if she did well and would like to continue with this medication.  At that time we can arrange prior authorization to get coverage.

## 2020-05-14 NOTE — Progress Notes (Signed)
05/14/2020 Sharon Buck [birth year] [id number]   HPI:  Sharon Buck is a 64 y.o. female patient of Dr Gwenlyn Found, who presents today for a lipid clinic evaluation.  See pertinent past medical history below.  Labs drawn in April showed her LDL at 129, on pravastatin.  Previously had allergic reaction to rosuvastatin.  Today she has complaints of taking too many pills, states that she now tends to gag in trying to swallow her pills, even 1-2 at a time.  Also she notes that her lower extremity edema still is a problem.  Previously she was on furosemide, which helped, but now takes hctz and notes that it doesn't seem to do anything.  She was also previously on amlodipine 10 mg daily, this has been reduced to 5 mg.  It was noted that she had some initial improvement with the decrease in dose, but she states now swelling is fully back.  She also notes some arthritis pain in her joints, but has associated this with age/weight.  We discussed the possibility of this being caused by pravastatin, but will continue it for now and consider a trial without it once she is on PCSK-9 inhibitor.    Past Medical History: ASCVD S/p pci to LAD 4/19, repeat cath 12/19  hypertension On olmesartan 20, amlodipine 5, hctz 12.5, metoprolol 25 qd  obesity BMI 40.5  Bilateral LEE Chronic, worse in evenings    Current Medications: pravastatin 20 mg   Cholesterol Goals: LDL < 70   Intolerant/previously tried: atorvastatin 80 mg, rosuvastatin 20 mg - rash  Family history: father (20) and pgf both with CHF, mgf -stroke lived to 26, mother died at 64 from [place], mgm lived to 73 month shy of 79; 1 brother - healthy, 1 sister - died from complications DM1; 2 children, no health issues  Diet: home cooked meals; MF eats veggies; meats on weekend; beans for protein during the week; often just 2-3 veggies for dinner  Exercise:  Walks most days, dances at beach  Labs: 4/21:  TC 200, TG 170, HDL 37, LDL 129   Current Outpatient  Medications  Medication Sig Dispense Refill  . albuterol (PROVENTIL HFA;VENTOLIN HFA) 108 (90 BASE) MCG/ACT inhaler Inhale 2 puffs into the lungs every 6 (six) hours as needed for wheezing or shortness of breath.    Marland Kitchen amLODipine (NORVASC) 5 MG tablet Take 1 tablet by mouth daily 90 tablet 2  . Arginine 1000 MG TABS Take 1,000 mg by mouth daily.    Marland Kitchen aspirin EC 81 MG tablet Take 81 mg by mouth daily.    . clopidogrel (PLAVIX) 75 MG tablet Take 1 tablet (75 mg total) by mouth daily. 30 tablet 5  . DULoxetine (CYMBALTA) 30 MG capsule Take 90 mg by mouth daily with lunch.    . fluticasone (FLONASE) 50 MCG/ACT nasal spray Place 2 sprays into both nostrils daily as needed for allergies.     . isosorbide mononitrate (IMDUR) 60 MG 24 hr tablet Take 3 tablets (180 mg total) by mouth daily. 90 tablet 5  . loratadine (CLARITIN) 10 MG tablet Take 10 mg by mouth daily as needed for allergies.    . metoprolol succinate (TOPROL-XL) 25 MG 24 hr tablet Take 1 tablet (25 mg total) by mouth daily. 90 tablet 3  . nitroGLYCERIN (NITROSTAT) 0.4 MG SL tablet Place 1 tablet (0.4 mg total) under the tongue every 5 (five) minutes x 3 doses as needed for chest pain. 25 tablet 2  . olmesartan (  BENICAR) 20 MG tablet Take 20 mg by mouth daily.    . pramipexole (MIRAPEX) 0.25 MG tablet Take 0.5 mg by mouth at bedtime.     . hydrochlorothiazide (MICROZIDE) 12.5 MG capsule Take 2 capsules (25 mg total) by mouth daily. 180 capsule 3  . pravastatin (PRAVACHOL) 20 MG tablet Take 1 tablet (20 mg total) by mouth every evening. 90 tablet 3   No current facility-administered medications for this visit.    Allergies  Allergen Reactions  . Prednisone Palpitations and Rash    Makes my heart race  . Crestor [Rosuvastatin Calcium] Rash and Other (See Comments)    Makes her skin burn from the inside out.    Past Medical History:  Diagnosis Date  . Asthma   . Hypertension     Blood pressure 122/84.   Essential  hypertension Patient continues to have LEE despite cutting back on amlodipine from 10 mg to 5 mg daily.  BP currently well controlled, notes that home BP readings are mostly 629-528 systolic.  I am going to have her stop the amlodipine for now and see if issue improves in the next 2-3 weeks.  Will consider increasing olmesartan from 20 to 40 mg daily if amlodipine is cause of edema and blood pressure increases.  Also Syler need to switch from hydrochlorothiazide back to furosemide if edema continues.    Dyslipidemia, goal LDL below 70 Patient with ASCVD and LDL > 70 currently only able to tolerate pravastatin 20 mg daily.  She was given a sample of Repatha 140 mg in the office today to try.  She is to call back in a week to let us know if she did well and would like to continue with this medication.  At that time we can arrange prior authorization to get coverage.     Tommy Medal PharmD CPP [facility] [address] [place], [place] [address] [phone]

## 2020-05-21 ENCOUNTER — Telehealth: Payer: Self-pay | Admitting: Pharmacist Clinician (PhC)/ Clinical Pharmacy Specialist

## 2020-05-21 NOTE — Telephone Encounter (Signed)
Patient called to report that BP has stayed in the 120-130's since stopping amlodipine. LEE mostly gone, still has occasional swelling, but no pain or swelling to the point of not seeing her ankles.  States that has had a few readings up to 448 systolic this week, but due to added stresses at home.  Will have her continue with current medications for now, but advised that if BP remains as high as 185 systolic will need to adjust current medications.  Also was given a sample of Repatha 140 mg, states no issues other than mild headache after dose.  Would like to get PA and start on medication.   PA will be started today

## 2020-05-22 MED ORDER — REPATHA SURECLICK 140 MG/ML ~~LOC~~ SOAJ: 140.0000 mg | SUBCUTANEOUS | 11 refills | Status: AC

## 2020-05-22 NOTE — Telephone Encounter (Signed)
Called and spoke w/pt regarding the approval of the repatha, rx sent, pt instructed on how to use the copay card, pt voiced understanding.

## 2020-05-22 NOTE — Addendum Note (Signed)
Addended by: Allean Found on: 05/22/2020 11:27 AM   Modules accepted: Orders

## 2020-07-03 DIAGNOSIS — M1711 Unilateral primary osteoarthritis, right knee: Secondary | ICD-10-CM | POA: Diagnosis not present

## 2020-07-15 DIAGNOSIS — M170 Bilateral primary osteoarthritis of knee: Secondary | ICD-10-CM | POA: Diagnosis not present

## 2020-07-24 DIAGNOSIS — I25118 Atherosclerotic heart disease of native coronary artery with other forms of angina pectoris: Secondary | ICD-10-CM | POA: Diagnosis not present

## 2020-07-24 DIAGNOSIS — E1151 Type 2 diabetes mellitus with diabetic peripheral angiopathy without gangrene: Secondary | ICD-10-CM | POA: Diagnosis not present

## 2020-07-24 DIAGNOSIS — Z23 Encounter for immunization: Secondary | ICD-10-CM | POA: Diagnosis not present

## 2020-08-07 DIAGNOSIS — E785 Hyperlipidemia, unspecified: Secondary | ICD-10-CM | POA: Diagnosis not present

## 2020-08-12 DIAGNOSIS — Z1231 Encounter for screening mammogram for malignant neoplasm of breast: Secondary | ICD-10-CM | POA: Diagnosis not present

## 2020-09-09 ENCOUNTER — Other Ambulatory Visit: Payer: Self-pay | Admitting: Cardiovascular Disease

## 2021-02-10 DIAGNOSIS — E1151 Type 2 diabetes mellitus with diabetic peripheral angiopathy without gangrene: Secondary | ICD-10-CM | POA: Diagnosis not present

## 2021-02-10 DIAGNOSIS — E785 Hyperlipidemia, unspecified: Secondary | ICD-10-CM | POA: Diagnosis not present

## 2021-02-10 DIAGNOSIS — E039 Hypothyroidism, unspecified: Secondary | ICD-10-CM | POA: Diagnosis not present

## 2021-02-17 DIAGNOSIS — Z1331 Encounter for screening for depression: Secondary | ICD-10-CM | POA: Diagnosis not present

## 2021-02-17 DIAGNOSIS — I10 Essential (primary) hypertension: Secondary | ICD-10-CM | POA: Diagnosis not present

## 2021-02-17 DIAGNOSIS — Z0000 Encounter for general adult medical examination without abnormal findings: Secondary | ICD-10-CM | POA: Diagnosis not present

## 2021-02-17 DIAGNOSIS — Z1339 Encounter for screening examination for other mental health and behavioral disorders: Secondary | ICD-10-CM | POA: Diagnosis not present

## 2021-02-17 DIAGNOSIS — R82998 Other abnormal findings in urine: Secondary | ICD-10-CM | POA: Diagnosis not present

## 2021-02-28 ENCOUNTER — Encounter: Payer: Self-pay | Admitting: Internal Medicine

## 2021-05-01 ENCOUNTER — Telehealth: Payer: Self-pay | Admitting: *Deleted

## 2021-05-01 NOTE — Telephone Encounter (Signed)
Spoke with patient. Stated she is off Plavix

## 2021-05-01 NOTE — Telephone Encounter (Signed)
Called pt to verify she is OFF Plavix/ Clopidogrel LMTRC- Sharon Buck [facility]

## 2021-05-13 ENCOUNTER — Telehealth: Payer: Self-pay | Admitting: *Deleted

## 2021-05-13 NOTE — Telephone Encounter (Signed)
Unable to reach patient for virtual pre-visit. VM left asking for return phone call or reschedule pre-visit if after 5 pm today.

## 2021-05-20 ENCOUNTER — Other Ambulatory Visit: Payer: Self-pay

## 2021-05-20 ENCOUNTER — Ambulatory Visit (AMBULATORY_SURGERY_CENTER): Payer: Self-pay | Admitting: *Deleted

## 2021-05-20 VITALS — Ht 67.0 in | Wt 253.0 lb

## 2021-05-20 DIAGNOSIS — Z1211 Encounter for screening for malignant neoplasm of colon: Secondary | ICD-10-CM

## 2021-05-20 MED ORDER — SUTAB 1479-225-188 MG PO TABS: 1.0000 | ORAL_TABLET | Freq: Once | ORAL | 0 refills | Status: AC

## 2021-05-20 NOTE — Progress Notes (Signed)
  No trouble with anesthesia, denies being told they were difficult to intubate, or hx/fam hx of malignant hyperthermia per pt   Sutab code put into RX and paper copy given to pt to show pharmacy   No egg or soy allergy  No home oxygen use   No medications for weight loss taken  emmi information given  Pt denies constipation issues  Pt informed that we do not do prior authorizations for prep  Pt states "it has been so long since I've taken a nitro- I can't remember when it was."

## 2021-05-27 ENCOUNTER — Encounter: Payer: Self-pay | Admitting: Internal Medicine

## 2021-05-27 ENCOUNTER — Ambulatory Visit (AMBULATORY_SURGERY_CENTER): Payer: BC Managed Care – PPO | Admitting: Internal Medicine

## 2021-05-27 VITALS — BP 143/74 | HR 56 | Temp 99.5°F | Resp 12 | Ht 67.0 in | Wt 253.0 lb

## 2021-05-27 DIAGNOSIS — Z1211 Encounter for screening for malignant neoplasm of colon: Secondary | ICD-10-CM | POA: Diagnosis not present

## 2021-05-27 DIAGNOSIS — D120 Benign neoplasm of cecum: Secondary | ICD-10-CM

## 2021-05-27 DIAGNOSIS — D125 Benign neoplasm of sigmoid colon: Secondary | ICD-10-CM

## 2021-05-27 DIAGNOSIS — D124 Benign neoplasm of descending colon: Secondary | ICD-10-CM | POA: Diagnosis not present

## 2021-05-27 DIAGNOSIS — D123 Benign neoplasm of transverse colon: Secondary | ICD-10-CM | POA: Diagnosis not present

## 2021-05-27 MED ORDER — SODIUM CHLORIDE 0.9 % IV SOLN: 500.0000 mL | Freq: Once | INTRAVENOUS | Status: DC

## 2021-05-27 NOTE — Progress Notes (Signed)
VS - DT  Cell phone off per pt.  Pt's states no medical or surgical changes since previsit or office visit.

## 2021-05-27 NOTE — Patient Instructions (Signed)
Await pathology- 5 polyps removed  Please read over handouts about polyps and diverticulosis  Next colonoscopy- 3 years  Continue your normal medications    YOU HAD AN ENDOSCOPIC PROCEDURE TODAY AT [facility]:   Refer to the procedure report that was given to you for any specific questions about what was found during the examination.  If the procedure report does not answer your questions, please call your gastroenterologist to clarify.  If you requested that your care partner not be given the details of your procedure findings, then the procedure report has been included in a sealed envelope for you to review at your convenience later.  YOU SHOULD EXPECT: Some feelings of bloating in the abdomen. Passage of more gas than usual.  Walking can help get rid of the air that was put into your GI tract during the procedure and reduce the bloating. If you had a lower endoscopy (such as a colonoscopy or flexible sigmoidoscopy) you Beilke notice spotting of blood in your stool or on the toilet paper. If you underwent a bowel prep for your procedure, you [place] not have a normal bowel movement for a few days.  Please Note:  You might notice some irritation and congestion in your nose or some drainage.  This is from the oxygen used during your procedure.  There is no need for concern and it should clear up in a day or so.  SYMPTOMS TO REPORT IMMEDIATELY:  Following lower endoscopy (colonoscopy or flexible sigmoidoscopy):  Excessive amounts of blood in the stool  Significant tenderness or worsening of abdominal pains  Swelling of the abdomen that is new, acute  Fever of 100F or higher  For urgent or emergent issues, a gastroenterologist can be reached at any hour by calling [phone]. Do not use MyChart messaging for urgent concerns.    DIET:  We do recommend a small meal at first, but then you Umar proceed to your regular diet.  Drink plenty of fluids but you should avoid alcoholic  beverages for 24 hours.  ACTIVITY:  You should plan to take it easy for the rest of today and you should NOT DRIVE or use heavy machinery until tomorrow (because of the sedation medicines used during the test).    FOLLOW UP: Our staff will call the number listed on your records 48-72 hours following your procedure to check on you and address any questions or concerns that you [place] have regarding the information given to you following your procedure. If we do not reach you, we will leave a message.  We will attempt to reach you two times.  During this call, we will ask if you have developed any symptoms of COVID 19. If you develop any symptoms (ie: fever, flu-like symptoms, shortness of breath, cough etc.) before then, please call [phone].  If you test positive for Covid 19 in the 2 weeks post procedure, please call and report this information to [place].    If any biopsies were taken you will be contacted by phone or by letter within the next 1-3 weeks.  Please call us at [phone] if you have not heard about the biopsies in 3 weeks.    SIGNATURES/CONFIDENTIALITY: You and/or your care partner have signed paperwork which will be entered into your electronic medical record.  These signatures attest to the fact that that the information above on your After Visit Summary has been reviewed and is understood.  Full responsibility of the confidentiality of this discharge  information lies with you and/or your care-partner.

## 2021-05-27 NOTE — Progress Notes (Signed)
Patient ID: Sharon Buck, female   DOB: 02-Feb-1956, 65 y.o.   MRN: [id number]   GI PREPROCEDURE NOTE  HISTORY OF PRESENT ILLNESS:  Sharon Buck is a 65 y.o. female who presents today for routine screening colonoscopy.  She is having no active complaints.  Last examination 2007 was negative for neoplasia.  REVIEW OF SYSTEMS:  All non-GI ROS negative.  Past Medical History:  Diagnosis Date   Allergy    Asthma    Hyperlipidemia    Hypertension     Past Surgical History:  Procedure Laterality Date   ABDOMINAL HYSTERECTOMY     CHOLECYSTECTOMY     COLONOSCOPY     CORONARY BALLOON ANGIOPLASTY N/A 09/30/2018   Procedure: CORONARY BALLOON ANGIOPLASTY;  Surgeon: Nigel Mormon, MD;  Location: [facility];  Service: Cardiovascular;  Laterality: N/A;   CORONARY STENT INTERVENTION N/A 01/14/2018   Procedure: CORONARY STENT INTERVENTION;  Surgeon: Nigel Mormon, MD;  Location: [facility];  Service: Cardiovascular;  Laterality: N/A;   INTRAVASCULAR PRESSURE WIRE/FFR STUDY N/A 01/14/2018   Procedure: INTRAVASCULAR PRESSURE WIRE/FFR STUDY;  Surgeon: Nigel Mormon, MD;  Location: [facility];  Service: Cardiovascular;  Laterality: N/A;   INTRAVASCULAR ULTRASOUND/IVUS N/A 09/30/2018   Procedure: Intravascular Ultrasound/IVUS;  Surgeon: Nigel Mormon, MD;  Location: [facility];  Service: Cardiovascular;  Laterality: N/A;   LEFT HEART CATH AND CORONARY ANGIOGRAPHY N/A 01/14/2018   Procedure: LEFT HEART CATH AND CORONARY ANGIOGRAPHY;  Surgeon: Nigel Mormon, MD;  Location: [facility];  Service: Cardiovascular;  Laterality: N/A;   LEFT HEART CATH AND CORONARY ANGIOGRAPHY N/A 09/30/2018   Procedure: LEFT HEART CATH AND CORONARY ANGIOGRAPHY;  Surgeon: Nigel Mormon, MD;  Location: [facility];  Service: Cardiovascular;  Laterality: N/A;   MENISCUS REPAIR Left    TUBAL LIGATION      Social History Sharon Buck   reports that she has never smoked. She has never used smokeless tobacco. She reports current alcohol use. She reports that she does not use drugs.  family history includes Asthma in her mother; Diabetes in her father; Heart disease in her father; Hypertension in her father.  Allergies  Allergen Reactions   Prednisone Palpitations and Rash    Makes my heart race   Crestor [Rosuvastatin Calcium] Rash and Other (See Comments)    Makes her skin burn from the inside out.       PHYSICAL EXAMINATION:  Vital signs: BP (!) 175/87   Pulse (!) 58   Temp 99.5 F (37.5 C)   Resp 14   Ht '5\' 7"'$  (1.702 m)   Wt 253 lb (114.8 kg)   SpO2 98%   BMI 39.63 kg/m  General: Well-developed, well-nourished, no acute distress HEENT: Sclerae are anicteric, conjunctiva pink. Oral mucosa intact Lungs: Clear Heart: Regular Abdomen: soft, nontender, nondistended, no obvious ascites, no peritoneal signs, normal bowel sounds. No organomegaly. Extremities: No edema Psychiatric: alert and oriented x3. Cooperative     ASSESSMENT:  1.  Screening colonoscopy.  Appropriate candidate without contraindication   PLAN:  1.  Screening colonoscopy.The nature of the procedure, as well as the risks, benefits, and alternatives were carefully and thoroughly reviewed with the patient. Ample time for discussion and questions allowed. The patient understood, was satisfied, and agreed to proceed.

## 2021-05-27 NOTE — Op Note (Signed)
[place] [facility] Patient Name: Sharon Buck Procedure Date: 05/27/2021 12:04 PM MRN: [id number] Endoscopist: Docia Chuck. Henrene Pastor , MD Age: 65 Referring MD:  Date of Birth: August 25, 1956 Gender: Female Account #: [id number] Procedure:                Colonoscopy with cold snare polypectomy x 5 Indications:              Screening for colorectal malignant neoplasm. Index                            examination 2007 was negative for neoplasia Medicines:                Monitored Anesthesia Care Procedure:                Pre-Anesthesia Assessment:                           - Prior to the procedure, a History and Physical                            was performed, and patient medications and                            allergies were reviewed. The patient's tolerance of                            previous anesthesia was also reviewed. The risks                            and benefits of the procedure and the sedation                            options and risks were discussed with the patient.                            All questions were answered, and informed consent                            was obtained. Prior Anticoagulants: The patient has                            taken no previous anticoagulant or antiplatelet                            agents. ASA Grade Assessment: II - A patient with                            mild systemic disease. After reviewing the risks                            and benefits, the patient was deemed in                            satisfactory condition to undergo the procedure.  After obtaining informed consent, the colonoscope                            was passed under direct vision. Throughout the                            procedure, the patient's blood pressure, pulse, and                            oxygen saturations were monitored continuously. The                            Olympus CF-HQ190L (Serial# [id number]) Colonoscope was                             introduced through the anus and advanced to the the                            cecum, identified by appendiceal orifice and                            ileocecal valve. The ileocecal valve, appendiceal                            orifice, and rectum were photographed. The quality                            of the bowel preparation was excellent. The                            colonoscopy was performed without difficulty. The                            patient tolerated the procedure well. The bowel                            preparation used was SUPREP via split dose                            instruction. Scope In: 12:12:58 PM Scope Out: 12:31:29 PM Scope Withdrawal Time: 0 hours 16 minutes 37 seconds  Total Procedure Duration: 0 hours 18 minutes 31 seconds  Findings:                 Five polyps were found in the sigmoid colon,                            descending colon, transverse colon and cecum. The                            polyps were 2 to 5 mm in size. These polyps were                            removed with a cold snare. Resection and retrieval  were complete.                           Multiple diverticula were found in the sigmoid                            colon.                           The exam was otherwise without abnormality on                            direct and retroflexion views. Complications:            No immediate complications. Estimated blood loss:                            None. Estimated Blood Loss:     Estimated blood loss: none. Impression:               - Five 2 to 5 mm polyps in the sigmoid colon, in                            the descending colon, in the transverse colon and                            in the cecum, removed with a cold snare. Resected                            and retrieved.                           - Diverticulosis in the sigmoid colon.                           - The examination was otherwise  normal on direct                            and retroflexion views. Recommendation:           - Repeat colonoscopy in 3 years for surveillance.                           - Patient has a contact number available for                            emergencies. The signs and symptoms of potential                            delayed complications were discussed with the                            patient. Return to normal activities tomorrow.                            Written discharge instructions were provided to the  patient.                           - Resume previous diet.                           - Continue present medications.                           - Await pathology results. Docia Chuck. Henrene Pastor, MD 05/27/2021 12:37:59 PM This report has been signed electronically.

## 2021-05-27 NOTE — Progress Notes (Signed)
Report to PACU, RN, vss, BBS= Clear.  

## 2021-05-27 NOTE — Progress Notes (Signed)
Called to room to assist during endoscopic procedure.  Patient ID and intended procedure confirmed with present staff. Received instructions for my participation in the procedure from the performing physician.  

## 2021-05-29 ENCOUNTER — Telehealth: Payer: Self-pay

## 2021-05-29 NOTE — Telephone Encounter (Signed)
  Follow up Call-  Call back number 05/27/2021  Post procedure Call Back phone  # [phone] ex [phone]  Permission to leave phone message Yes  Some recent data might be hidden     Patient questions:  Do you have a fever, pain , or abdominal swelling? No. Pain Score  0 *  Have you tolerated food without any problems? Yes.    Have you been able to return to your normal activities? Yes.    Do you have any questions about your discharge instructions: Diet   No. Medications  No. Follow up visit  No.  Do you have questions or concerns about your Care? No.  Actions: * If pain score is 4 or above: No action needed, pain <4.  Have you developed a fever since your procedure? no  2.   Have you had an respiratory symptoms (SOB or cough) since your procedure? no  3.   Have you tested positive for COVID 19 since your procedure no  4.   Have you had any family members/close contacts diagnosed with the COVID 19 since your procedure?  no   If yes to any of these questions please route to Joylene John, RN and Joella Prince, RN

## 2021-05-29 NOTE — Telephone Encounter (Signed)
First attempt follow up call to pt, lm for pt to call if having any problems or questions, otherwise we will call them back later this morning or early this afternoon.  °

## 2021-06-04 ENCOUNTER — Encounter: Payer: Self-pay | Admitting: Internal Medicine

## 2021-06-27 DIAGNOSIS — I10 Essential (primary) hypertension: Secondary | ICD-10-CM | POA: Diagnosis not present

## 2021-06-27 DIAGNOSIS — E1151 Type 2 diabetes mellitus with diabetic peripheral angiopathy without gangrene: Secondary | ICD-10-CM | POA: Diagnosis not present

## 2021-06-27 DIAGNOSIS — R112 Nausea with vomiting, unspecified: Secondary | ICD-10-CM | POA: Diagnosis not present

## 2021-08-18 DIAGNOSIS — Z1231 Encounter for screening mammogram for malignant neoplasm of breast: Secondary | ICD-10-CM | POA: Diagnosis not present

## 2021-11-07 DIAGNOSIS — Z23 Encounter for immunization: Secondary | ICD-10-CM | POA: Diagnosis not present

## 2021-11-07 DIAGNOSIS — E1151 Type 2 diabetes mellitus with diabetic peripheral angiopathy without gangrene: Secondary | ICD-10-CM | POA: Diagnosis not present

## 2022-02-26 DIAGNOSIS — E785 Hyperlipidemia, unspecified: Secondary | ICD-10-CM | POA: Diagnosis not present

## 2022-02-26 DIAGNOSIS — E039 Hypothyroidism, unspecified: Secondary | ICD-10-CM | POA: Diagnosis not present

## 2022-02-26 DIAGNOSIS — E1151 Type 2 diabetes mellitus with diabetic peripheral angiopathy without gangrene: Secondary | ICD-10-CM | POA: Diagnosis not present

## 2022-03-02 DIAGNOSIS — Z0000 Encounter for general adult medical examination without abnormal findings: Secondary | ICD-10-CM | POA: Diagnosis not present

## 2022-03-02 DIAGNOSIS — E1151 Type 2 diabetes mellitus with diabetic peripheral angiopathy without gangrene: Secondary | ICD-10-CM | POA: Diagnosis not present

## 2022-07-07 DIAGNOSIS — I10 Essential (primary) hypertension: Secondary | ICD-10-CM | POA: Diagnosis not present

## 2022-07-07 DIAGNOSIS — M79672 Pain in left foot: Secondary | ICD-10-CM | POA: Diagnosis not present

## 2022-07-07 DIAGNOSIS — Z23 Encounter for immunization: Secondary | ICD-10-CM | POA: Diagnosis not present

## 2022-07-07 DIAGNOSIS — E1151 Type 2 diabetes mellitus with diabetic peripheral angiopathy without gangrene: Secondary | ICD-10-CM | POA: Diagnosis not present

## 2022-09-14 DIAGNOSIS — Z1231 Encounter for screening mammogram for malignant neoplasm of breast: Secondary | ICD-10-CM | POA: Diagnosis not present

## 2022-11-04 DIAGNOSIS — E1151 Type 2 diabetes mellitus with diabetic peripheral angiopathy without gangrene: Secondary | ICD-10-CM | POA: Diagnosis not present

## 2022-11-04 DIAGNOSIS — I10 Essential (primary) hypertension: Secondary | ICD-10-CM | POA: Diagnosis not present

## 2022-11-04 DIAGNOSIS — I25118 Atherosclerotic heart disease of native coronary artery with other forms of angina pectoris: Secondary | ICD-10-CM | POA: Diagnosis not present

## 2023-03-22 DIAGNOSIS — E039 Hypothyroidism, unspecified: Secondary | ICD-10-CM | POA: Diagnosis not present

## 2023-03-22 DIAGNOSIS — R7989 Other specified abnormal findings of blood chemistry: Secondary | ICD-10-CM | POA: Diagnosis not present

## 2023-03-22 DIAGNOSIS — I10 Essential (primary) hypertension: Secondary | ICD-10-CM | POA: Diagnosis not present

## 2023-03-22 DIAGNOSIS — E785 Hyperlipidemia, unspecified: Secondary | ICD-10-CM | POA: Diagnosis not present

## 2023-03-22 DIAGNOSIS — E1151 Type 2 diabetes mellitus with diabetic peripheral angiopathy without gangrene: Secondary | ICD-10-CM | POA: Diagnosis not present

## 2023-03-26 DIAGNOSIS — Z1331 Encounter for screening for depression: Secondary | ICD-10-CM | POA: Diagnosis not present

## 2023-03-26 DIAGNOSIS — Z1339 Encounter for screening examination for other mental health and behavioral disorders: Secondary | ICD-10-CM | POA: Diagnosis not present

## 2023-03-26 DIAGNOSIS — Z0000 Encounter for general adult medical examination without abnormal findings: Secondary | ICD-10-CM | POA: Diagnosis not present

## 2023-03-26 DIAGNOSIS — E1151 Type 2 diabetes mellitus with diabetic peripheral angiopathy without gangrene: Secondary | ICD-10-CM | POA: Diagnosis not present

## 2023-03-26 DIAGNOSIS — R82998 Other abnormal findings in urine: Secondary | ICD-10-CM | POA: Diagnosis not present

## 2023-03-26 DIAGNOSIS — I10 Essential (primary) hypertension: Secondary | ICD-10-CM | POA: Diagnosis not present

## 2023-05-25 DIAGNOSIS — E1151 Type 2 diabetes mellitus with diabetic peripheral angiopathy without gangrene: Secondary | ICD-10-CM | POA: Diagnosis not present

## 2023-05-25 DIAGNOSIS — N289 Disorder of kidney and ureter, unspecified: Secondary | ICD-10-CM | POA: Diagnosis not present

## 2023-07-23 DIAGNOSIS — M25571 Pain in right ankle and joints of right foot: Secondary | ICD-10-CM | POA: Diagnosis not present

## 2023-07-23 DIAGNOSIS — S2241XA Multiple fractures of ribs, right side, initial encounter for closed fracture: Secondary | ICD-10-CM | POA: Diagnosis not present

## 2023-07-23 DIAGNOSIS — M7989 Other specified soft tissue disorders: Secondary | ICD-10-CM | POA: Diagnosis not present

## 2023-07-23 DIAGNOSIS — R0781 Pleurodynia: Secondary | ICD-10-CM | POA: Diagnosis not present

## 2023-07-23 DIAGNOSIS — S20211A Contusion of right front wall of thorax, initial encounter: Secondary | ICD-10-CM | POA: Diagnosis not present

## 2023-08-04 DIAGNOSIS — E1151 Type 2 diabetes mellitus with diabetic peripheral angiopathy without gangrene: Secondary | ICD-10-CM | POA: Diagnosis not present

## 2023-08-04 DIAGNOSIS — Z23 Encounter for immunization: Secondary | ICD-10-CM | POA: Diagnosis not present

## 2023-08-04 DIAGNOSIS — I10 Essential (primary) hypertension: Secondary | ICD-10-CM | POA: Diagnosis not present
